# Patient Record
Sex: Male | Born: 1990
Health system: Southern US, Community
[De-identification: ages and names within clinical notes are randomized; demographics above are authoritative.]

---
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
## 2008-06-23 ENCOUNTER — Encounter: Payer: Self-pay | Admitting: Family Medicine

## 2008-12-17 ENCOUNTER — Ambulatory Visit: Payer: Self-pay | Admitting: Family Medicine

## 2008-12-17 DIAGNOSIS — F952 Tourette's disorder: Secondary | ICD-10-CM | POA: Insufficient documentation

## 2008-12-17 DIAGNOSIS — G43909 Migraine, unspecified, not intractable, without status migrainosus: Secondary | ICD-10-CM | POA: Insufficient documentation

## 2008-12-19 ENCOUNTER — Telehealth: Payer: Self-pay | Admitting: Family Medicine

## 2009-11-27 ENCOUNTER — Telehealth: Payer: Self-pay | Admitting: Family Medicine

## 2009-11-27 ENCOUNTER — Ambulatory Visit: Payer: Self-pay | Admitting: Family Medicine

## 2009-11-27 DIAGNOSIS — J019 Acute sinusitis, unspecified: Secondary | ICD-10-CM

## 2010-06-17 ENCOUNTER — Ambulatory Visit
Admission: RE | Admit: 2010-06-17 | Discharge: 2010-06-17 | Payer: Self-pay | Source: Home / Self Care | Attending: Family Medicine | Admitting: Family Medicine

## 2010-07-08 NOTE — Letter (Signed)
Summary: Records from [facility] 1991 - 2009  Records from [facility] 1991 - 2009   Imported By: Maryln Gottron 12/26/2008 12:38:41  _____________________________________________________________________  External Attachment:    Type:   Image     Comment:   External Document

## 2010-07-08 NOTE — Progress Notes (Signed)
Summary: sore throat/chest congestion  Phone Note Call from Patient   Caller: Patient Call For: Nelwyn Salisbury MD Summary of Call: Pt is asking to be seen today.  Sore throat with fever that started Sunday.....Marland Kitchenstill has sore throat and congestion in chest.   Please call back if he can be seen or what he is to do? [phone] Initial call taken by: Lynann Beaver CMA,  November 27, 2009 11:16 AM  Follow-up for Phone Call        please work him in this afternoon Follow-up by: Nelwyn Salisbury MD,  November 27, 2009 11:24 AM  Additional Follow-up for Phone Call Additional follow up Details #1::        Done per [facility]. Additional Follow-up by: Lynann Beaver CMA,  November 27, 2009 11:38 AM

## 2010-07-08 NOTE — Progress Notes (Signed)
Summary: advice  Phone Note Call from Patient Call back at [phone]   Caller: Patient Call For: fry Summary of Call: pt came in to have his PPD read and he was wanting Dr Claris Che opinion on an OTC acne medicine called Epiduo.  Wanted to know if he recommends it Initial call taken by: Alfred Levins, CMA,  December 19, 2008 9:30 AM  Follow-up for Phone Call        go ahead and try it, I haven't heard anything specific about it Follow-up by: Nelwyn Salisbury MD,  December 19, 2008 1:02 PM  Additional Follow-up for Phone Call Additional follow up Details #1::        l/m on pt cell phone with Dr Claris Che instructions Additional Follow-up by: Alfred Levins, CMA,  December 19, 2008 3:49 PM

## 2010-07-08 NOTE — Letter (Signed)
Summary: Immunization records [phone] - 2008  Immunization records 1991 - 2008   Imported By: Maryln Gottron 12/19/2008 13:10:24  _____________________________________________________________________  External Attachment:    Type:   Image     Comment:   External Document

## 2010-07-08 NOTE — Assessment & Plan Note (Signed)
Summary: ?sinus inf/ok per doc/njr   Vital Signs:  Patient profile:   20 year old male Weight:      141 pounds BMI:     20.90 Temp:     98.7 degrees F oral BP sitting:   96 / 66  (left arm) Cuff size:   regular  Vitals Entered By: Raechel Ache, RN (November 27, 2009 1:01 PM) CC: C/o fever on Sunday- now has sore throat, cough and congestion.   History of Present Illness: Here for one week of sinus pressure, HA, ST, and a dry cough. No fever.   Allergies: 1)  ! Penicillin  Past History:  Past Medical History: Reviewed history from 12/17/2008 and no changes required. migraines Tourettes, mild  Review of Systems  The patient denies anorexia, fever, weight loss, weight gain, vision loss, decreased hearing, hoarseness, chest pain, syncope, dyspnea on exertion, peripheral edema, hemoptysis, abdominal pain, melena, hematochezia, severe indigestion/heartburn, hematuria, incontinence, genital sores, muscle weakness, suspicious skin lesions, transient blindness, difficulty walking, depression, unusual weight change, abnormal bleeding, enlarged lymph nodes, angioedema, breast masses, and testicular masses.    Physical Exam  General:  Well-developed,well-nourished,in no acute distress; alert,appropriate and cooperative throughout examination Head:  Normocephalic and atraumatic without obvious abnormalities. No apparent alopecia or balding. Eyes:  No corneal or conjunctival inflammation noted. EOMI. Perrla. Funduscopic exam benign, without hemorrhages, exudates or papilledema. Vision grossly normal. Ears:  External ear exam shows no significant lesions or deformities.  Otoscopic examination reveals clear canals, tympanic membranes are intact bilaterally without bulging, retraction, inflammation or discharge. Hearing is grossly normal bilaterally. Nose:  External nasal examination shows no deformity or inflammation. Nasal mucosa are pink and moist without lesions or exudates. Mouth:  Oral  mucosa and oropharynx without lesions or exudates.  Teeth in good repair. Neck:  No deformities, masses, or tenderness noted. Lungs:  Normal respiratory effort, chest expands symmetrically. Lungs are clear to auscultation, no crackles or wheezes.   Impression & Recommendations:  Problem # 1:  ACUTE SINUSITIS, UNSPECIFIED (ICD-461.9)  His updated medication list for this problem includes:    Zithromax Z-pak 250 Mg Tabs (Azithromycin) ..... As directed  Complete Medication List: 1)  Haloperidol 0.5 Mg Tabs (Haloperidol) .... Two times a day 2)  Zithromax Z-pak 250 Mg Tabs (Azithromycin) .... As directed  Patient Instructions: 1)  Please schedule a follow-up appointment as needed .  Prescriptions: ZITHROMAX Z-PAK 250 MG TABS (AZITHROMYCIN) as directed  #1 x 0   Entered and Authorized by:   Mylani Gentry A Mateen Franssen MD   Signed by:   Florine Sprenkle A Marchell Froman MD on 11/27/2009   Method used:   [facility]  [address] [id number]       [address]       [place], [place]  [address]       Ph: [phone] or [phone]       Fax: [phone]   RxID:   [id number]

## 2010-07-08 NOTE — Assessment & Plan Note (Signed)
Summary: new to be est/mhf   Vital Signs:  Patient profile:   20 year old male Height:      69 inches Weight:      139 pounds BMI:     20.60 Temp:     97.6 degrees F oral Pulse rate:   64 / minute BP sitting:   110 / 70  (left arm) Cuff size:   regular  Vitals Entered By: Alfred Levins, CMA (December 17, 2008 9:10 AM) CC: new to be established, CPX    History of Present Illness: 20 yr old male to establish and for a cpx. He has not seen a primary care doctor for several years. He has 2 issues to discuss. First he has had migraine HAs for years but has never been treated for them. He averages 2 or 3 a week. These are classic generalized moderate to severe HAs which have light and sound sensitivity, and they are usually preceded by auras of flashing lights or scotomata. he has little in the way of nausea or vomiting. He always takes some Motrin and goes to bed with thesem then they are gone when he wakes up later. He has never used a triptan med. Second he was diagnosed with Tourettes Syndrome years ago but was never treated. THis shows up with facial tics which are problematic mostly when he is very tired or stressed. He is interested in trying something for these now. He will be entering [employer] this fall to study English as a major.   Preventive Screening-Counseling & Management  Alcohol-Tobacco     Smoking Status: never      Drug Use:  no.    Allergies (verified): 1)  ! Penicillin  Past History:  Past Medical History: migraines Tourettes, mild  Past Surgical History: Denies surgical history  Family History: Reviewed history and no changes required. Family History of Stroke F 1st degree relative <60 Emotional/Mental Illness DM   Social History: Reviewed history and no changes required. Single Never Smoked Alcohol use-no Drug use-no student at [employer] Smoking Status:  never Drug Use:  no  Review of Systems  The patient denies anorexia, fever, weight loss, weight gain,  vision loss, decreased hearing, hoarseness, chest pain, syncope, dyspnea on exertion, peripheral edema, prolonged cough, hemoptysis, abdominal pain, melena, hematochezia, severe indigestion/heartburn, hematuria, incontinence, genital sores, muscle weakness, suspicious skin lesions, transient blindness, difficulty walking, depression, unusual weight change, abnormal bleeding, enlarged lymph nodes, angioedema, breast masses, and testicular masses.    Physical Exam  General:  Well-developed,well-nourished,in no acute distress; alert,appropriate and cooperative throughout examination Head:  Normocephalic and atraumatic without obvious abnormalities. No apparent alopecia or balding. Eyes:  No corneal or conjunctival inflammation noted. EOMI. Perrla. Funduscopic exam benign, without hemorrhages, exudates or papilledema. Vision grossly normal. Ears:  External ear exam shows no significant lesions or deformities.  Otoscopic examination reveals clear canals, tympanic membranes are intact bilaterally without bulging, retraction, inflammation or discharge. Hearing is grossly normal bilaterally. Nose:  External nasal examination shows no deformity or inflammation. Nasal mucosa are pink and moist without lesions or exudates. Mouth:  Oral mucosa and oropharynx without lesions or exudates.  Teeth in good repair. Neck:  No deformities, masses, or tenderness noted. Chest Wall:  No deformities, masses, tenderness or gynecomastia noted. Lungs:  Normal respiratory effort, chest expands symmetrically. Lungs are clear to auscultation, no crackles or wheezes. Heart:  Normal rate and regular rhythm. S1 and S2 normal without gallop, murmur, click, rub or other extra sounds. Abdomen:  Bowel sounds positive,abdomen soft and non-tender without masses, organomegaly or hernias noted. Genitalia:  Testes bilaterally descended without nodularity, tenderness or masses. No scrotal masses or lesions. No penis lesions or urethral  discharge. Msk:  No deformity or scoliosis noted of thoracic or lumbar spine.   Pulses:  R and L carotid,radial,femoral,dorsalis pedis and posterior tibial pulses are full and equal bilaterally Extremities:  No clubbing, cyanosis, edema, or deformity noted with normal full range of motion of all joints.   Neurologic:  No cranial nerve deficits noted. Station and gait are normal. Plantar reflexes are down-going bilaterally. DTRs are symmetrical throughout. Sensory, motor and coordinative functions appear intact. Skin:  Intact without suspicious lesions or rashes Cervical Nodes:  No lymphadenopathy noted Axillary Nodes:  No palpable lymphadenopathy Inguinal Nodes:  No significant adenopathy Psych:  Cognition and judgment appear intact. Alert and cooperative with normal attention span and concentration. No apparent delusions, illusions, hallucinations   Impression & Recommendations:  Problem # 1:  HEALTH MAINTENANCE EXAM (ICD-V70.0)  Problem # 2:  MIGRAINE HEADACHE (ICD-346.90)  His updated medication list for this problem includes:    Imitrex 100 Mg Tabs (Sumatriptan succinate) .Marland Kitchen... As needed headache  Problem # 3:  TOURETTE'S DISORDER (ICD-307.23)  Complete Medication List: 1)  Imitrex 100 Mg Tabs (Sumatriptan succinate) .... As needed headache 2)  Haloperidol 0.5 Mg Tabs (Haloperidol) .... Two times a day  Other Orders: Tdap => 72yrs IM (62831) Admin 1st Vaccine (51761) TB Skin Test [phone])  Patient Instructions: 1)  I reccomended he get fasting labs, but he declined. Given a tDaP and a PPD for school. Try low dose Haldol for the Tourettes and Imitrex for the HAs. Recheck here in 3 weeks.  Prescriptions: HALOPERIDOL 0.5 MG TABS (HALOPERIDOL) two times a day  #60 x 5   Entered and Authorized by:   Nelwyn Salisbury MD   Signed by:   Nelwyn Salisbury MD on 12/17/2008   Method used:   Print then Give to Patient   RxID:   [id number] IMITREX 100 MG TABS (SUMATRIPTAN SUCCINATE) as  needed headache  #12 x 5   Entered and Authorized by:   Nelwyn Salisbury MD   Signed by:   Nelwyn Salisbury MD on 12/17/2008   Method used:   Print then Give to Patient   RxID:   [id number]    Immunization History:  Hepatitis B Immunization History:    Hepatitis B # 1:  historical (07/22/1992)    Hepatitis B # 2:  historical (09/16/1992)    Hepatitis B # 3:  historical (02/02/1993)  DPT Immunization History:    DPT # 1:  historical (06/25/1989)    DPT # 2:  historical (08/17/1989)    DPT # 3:  historical (10/28/1989)    DPT # 4:  historical (12/07/1990)    DPT # 5:  historical (01/19/1995)  HIB Immunization History:    HIB # 1:  historical (06/25/1989)    HIB # 2:  historical (08/17/1989)    HIB # 3:  historical (10/28/1989)    HIB # 4:  historical (07/25/1990)  Polio Immunization History:    Polio # 1:  historical (06/25/1989)    Polio # 2:  historical (08/17/1989)    Polio # 3:  historical (12/07/1990)    Polio # 4:  historical (01/19/1995)  MMR Immunization History:    MMR # 1:  historical (07/25/1990)    MMR # 2:  historical (01/19/1995)  Varicella Immunization History:  Varicella # 1:  historical (12/06/1997)    Varicella # 2:  historical (11/23/2006)  Influenza Immunization History:    Influenza:  historical (04/02/1998)  Meningococcal Immunization History:    Meningococcal:  historical (11/23/2006)  Hepatitis A Immunization History:    Hepatitis A # 1:  historical (11/23/2006)    Hepatitis A # 2:  historical (06/24/2007)  Immunizations Administered:  Tetanus Vaccine:    Vaccine Type: Tdap    Site: right deltoid    Mfr: GlaxoSmithKline    Dose: 0.5 ml    Route: IM    Given by: Alfred Levins, CMA    Exp. Date: 03/09/2011    Lot #: [id number]  PPD Skin Test:    Vaccine Type: PPD    Site: left forearm    Mfr: Sanofi Pasteur    Dose: 0.1 ml    Route: ID    Given by: Alfred Levins, CMA    Exp. Date: 05/24/2011    Lot #: [id number]    Appended  Document: new to be est/mhf    Nurse Visit   Allergies: 1)  ! Penicillin  PPD Results    Date of reading: 12/19/2008    Results: < 5mm    Interpretation: negative

## 2010-07-10 NOTE — Assessment & Plan Note (Signed)
Summary: fu on migraines/njr   Vital Signs:  Patient profile:   20 year old male Weight:      155 pounds O2 Sat:      98 % Temp:     98.7 degrees F Pulse rate:   84 / minute BP sitting:   122 / 78  (left arm) Cuff size:   regular  Vitals Entered By: Pura Spice, RN (June 17, 2010 3:50 PM) CC: c/o frequent headaaches need rx haldol. wants  mole ck groin area    History of Present Illness: Here to follow up on migraines and Tourettes. He did very well on Haldol, but got away from this due to cost concerns. Now in the past few months the Tourettes has gotten slightly worse. The HAs come several times a month. He did not fill the Imitrex rx I gave him last year because his insurance would not cover it.   Allergies: 1)  ! Penicillin  Past History:  Past Medical History: Reviewed history from 12/17/2008 and no changes required. migraines Tourettes, mild  Past Surgical History: Reviewed history from 12/17/2008 and no changes required. Denies surgical history  Review of Systems  The patient denies anorexia, fever, weight loss, weight gain, vision loss, decreased hearing, hoarseness, chest pain, syncope, dyspnea on exertion, peripheral edema, prolonged cough, headaches, hemoptysis, abdominal pain, melena, hematochezia, severe indigestion/heartburn, hematuria, incontinence, genital sores, muscle weakness, suspicious skin lesions, transient blindness, difficulty walking, depression, unusual weight change, abnormal bleeding, enlarged lymph nodes, angioedema, breast masses, and testicular masses.    Physical Exam  General:  Well-developed,well-nourished,in no acute distress; alert,appropriate and cooperative throughout examination Head:  Normocephalic and atraumatic without obvious abnormalities. No apparent alopecia or balding. Eyes:  No corneal or conjunctival inflammation noted. EOMI. Perrla. Funduscopic exam benign, without hemorrhages, exudates or papilledema. Vision grossly  normal. Ears:  External ear exam shows no significant lesions or deformities.  Otoscopic examination reveals clear canals, tympanic membranes are intact bilaterally without bulging, retraction, inflammation or discharge. Hearing is grossly normal bilaterally. Nose:  External nasal examination shows no deformity or inflammation. Nasal mucosa are pink and moist without lesions or exudates. Mouth:  Oral mucosa and oropharynx without lesions or exudates.  Teeth in good repair. Neck:  No deformities, masses, or tenderness noted. Lungs:  Normal respiratory effort, chest expands symmetrically. Lungs are clear to auscultation, no crackles or wheezes. Heart:  Normal rate and regular rhythm. S1 and S2 normal without gallop, murmur, click, rub or other extra sounds. Neurologic:  No cranial nerve deficits noted. Station and gait are normal. Plantar reflexes are down-going bilaterally. DTRs are symmetrical throughout. Sensory, motor and coordinative functions appear intact.   Impression & Recommendations:  Problem # 1:  MIGRAINE HEADACHE (ICD-346.90)  Problem # 2:  TOURETTE'S DISORDER (ICD-307.23)  Complete Medication List: 1)  Haloperidol 0.5 Mg Tabs (Haloperidol) .... Two times a day  Patient Instructions: 1)  Refilled meds. Advised him to reduce his caffeine use. He will ask his insurance company what migraine meds they prefer and then let me know.  Prescriptions: HALOPERIDOL 0.5 MG TABS (HALOPERIDOL) two times a day  #60 x 11   Entered and Authorized by:   Nelwyn Salisbury MD   Signed by:   Nelwyn Salisbury MD on 06/17/2010   Method used:   [facility]        [facility]  [address] [phone] ([facility]ail[facility]       [address]  [place], [place]  [address]       Ph: [phone] or [phone]       Fax: [phone]   RxID:   [id number]    Orders Added: 1)  Est. Patient Level IV [66440]

## 2010-07-30 ENCOUNTER — Other Ambulatory Visit: Payer: Self-pay | Admitting: *Deleted

## 2010-07-30 NOTE — Telephone Encounter (Signed)
Treating Tourette's can be very tricky to do. I suggest he cut the Haldol back to 1/2 a tablet bid, and after that he needs to see a Neurologist for this. Please refer to Neurology for Tourette's syndrome.

## 2010-07-30 NOTE — Telephone Encounter (Signed)
Pt feels Haldol is making him too drowsy????   Fell asleep in class. [facility] Not[facility] it is causing him to become depressed.

## 2010-07-30 NOTE — Telephone Encounter (Signed)
Spoke with pt and he does not want referral to neurologist at this time.  Dr Clent Ridges aware.

## 2012-11-16 ENCOUNTER — Ambulatory Visit (INDEPENDENT_AMBULATORY_CARE_PROVIDER_SITE_OTHER)
Admission: RE | Admit: 2012-11-16 | Discharge: 2012-11-16 | Disposition: A | Discharge status: Home / Self Care | Payer: Managed Care, Other (non HMO) | Source: Ambulatory Visit | Attending: Family Medicine | Admitting: Family Medicine

## 2012-11-16 ENCOUNTER — Telehealth: Payer: Self-pay | Admitting: Family Medicine

## 2012-11-16 ENCOUNTER — Ambulatory Visit (INDEPENDENT_AMBULATORY_CARE_PROVIDER_SITE_OTHER): Payer: Managed Care, Other (non HMO) | Admitting: Family Medicine

## 2012-11-16 VITALS — BP 118/76 | Temp 98.6°F | Wt 160.0 lb

## 2012-11-16 DIAGNOSIS — M25561 Pain in right knee: Secondary | ICD-10-CM

## 2012-11-16 DIAGNOSIS — M25569 Pain in unspecified knee: Secondary | ICD-10-CM

## 2012-11-16 NOTE — Patient Instructions (Addendum)
-  go get xrays, we will call you with results in the next few days  -heat for 15 minutes twice daily  -gentle activities  -ibuprofen 400mg  2 times dialy or tylenol 500-1000mg  two times daily as needed for pain

## 2012-11-16 NOTE — Telephone Encounter (Signed)
Pt would like to let you know he had his Xrays and is anxious to get results.

## 2012-11-16 NOTE — Progress Notes (Signed)
Quick Note:  Called and spoke with pt and pt is aware. ______ 

## 2012-11-16 NOTE — Progress Notes (Signed)
Chief Complaint  Patient presents with  . 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  No past medical history on file.  No family history on file.  History   Social History  . Marital Status: Married    Spouse Name: N/A    Number of Children: N/A  . Years of Education: N/A   Social History Main Topics  . Smoking status: Not on file  . Smokeless tobacco: Not on file  . Alcohol Use: Not on file  . Drug Use: Not on file  . Sexually Active: Not on file   Other Topics Concern  . Not on file   Social History Narrative  . No narrative on file    No current outpatient prescriptions on file.  EXAM:  Filed Vitals:   11/16/12 0958  BP: 118/76  Temp: 98.6 F (37 C)    Body mass index is 23.62 kg/(m^2).  GENERAL: vitals reviewed and listed above, alert, oriented, appears well hydrated and in no acute distress  HEENT: atraumatic, conjunttiva clear, no obvious abnormalities on inspection of external nose and ears  NECK: no obvious masses on inspection  LUNGS: clear to auscultation bilaterally, no wheezes, rales or rhonchi, good air movement  CV: HRRR, no peripheral edema  MS: moves all extremities without noticeable abnormality -normal gait -normal appearance of both legs -TTP of R medial malleolus, R medial gastroc distally - otherwise normal exam, NV intact distally  PSYCH: pleasant and cooperative, no obvious depression or anxiety  ASSESSMENT  AND PLAN:  Discussed the following assessment and plan:  Pain in joint, lower leg, right - Plan: DG Ankle Complete Right, DG Tibia/Fibula Right  -suspect strain.sprain soft tissues from wrestling/new shoes - given degree of TTP and some bony on exam and his anxiety plain films to r/u stress fx, etc Recommendations per orders an instructions, risks and use of medications and return precautions discussed. -Patient advised to return or notify a doctor immediately if symptoms worsen or persist or new concerns arise.  Patient Instructions  -go get xrays, we will call you with results in the next few days  -heat for 15 minutes twice daily  -gentle activities  -ibuprofen 400mg  2 times dialy or tylenol 500-1000mg  two times daily as needed for pain     KIM, HANNAH R.

## 2013-11-08 ENCOUNTER — Encounter: Payer: Self-pay | Admitting: Family Medicine

## 2013-11-08 ENCOUNTER — Ambulatory Visit (INDEPENDENT_AMBULATORY_CARE_PROVIDER_SITE_OTHER): Payer: BC Managed Care – PPO | Admitting: Family Medicine

## 2013-11-08 ENCOUNTER — Telehealth: Payer: Self-pay | Admitting: Family Medicine

## 2013-11-08 VITALS — BP 109/77 | HR 71 | Temp 98.7°F | Ht 69.0 in | Wt 156.0 lb

## 2013-11-08 DIAGNOSIS — IMO0002 Reserved for concepts with insufficient information to code with codable children: Secondary | ICD-10-CM

## 2013-11-08 MED ORDER — CEPHALEXIN 500 MG PO CAPS: 500.0000 mg | ORAL_CAPSULE | Freq: Three times a day (TID) | ORAL | Status: AC

## 2013-11-08 NOTE — Progress Notes (Signed)
Pre visit review using our clinic review tool, if applicable. No additional management support is needed unless otherwise documented below in the visit note. 

## 2013-11-08 NOTE — Progress Notes (Signed)
   Subjective:    Patient ID: Roberto Powers, male    DOB: [birth year], 23 y.o.   MRN: [id number]  HPI Here for 2 weeks of swelling around the nail of the left third finger. Using hot Epsom salt soaks.    Review of Systems  Constitutional: Negative.   Skin: Positive for color change.       Objective:   Physical Exam  Constitutional: He appears well-developed and well-nourished.  Skin:  The left third finger has an area of erythema, swelling and tenderness at the nail edge          Assessment & Plan:  The area was cleansed with alcohol and then lanced with a sterile needle. Purulent material was expressed. Cover with Keflex for 10 days

## 2013-11-08 NOTE — Telephone Encounter (Signed)
Pt called wanting to know if it is ok to take the Keflex prescribed since he has a penicillin allergy.  Per Dr. Clent Ridges, yes it is okay.  I called pt back to inform him and he verbalized understanding.

## 2014-01-04 IMAGING — CR DG ANKLE COMPLETE 3+V*R*
3 series · 3 of 3 positions shown · non-contrast
Comparison: None.

CLINICAL DATA: Right ankle pain beginning 1 day ago.

RIGHT ANKLE - COMPLETE 3+ VIEW

[view not recorded (1 of 3)]
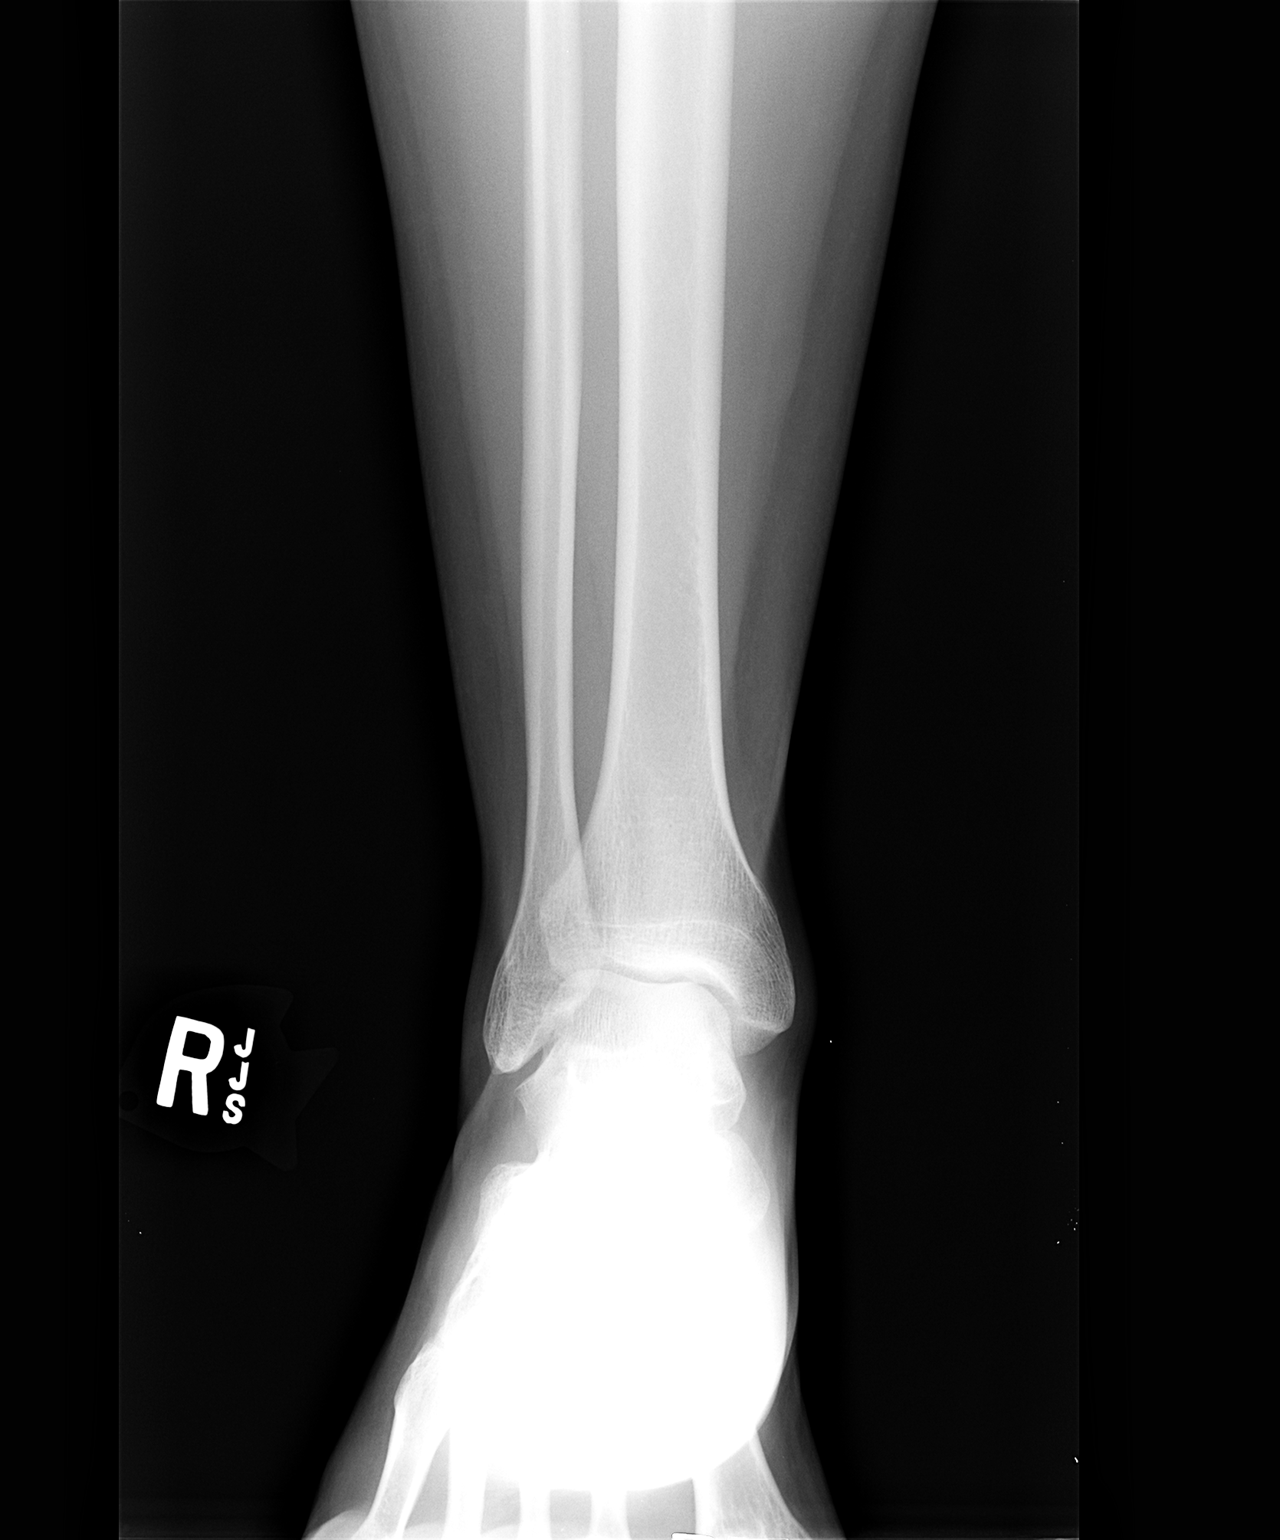

[view not recorded (2 of 3)]
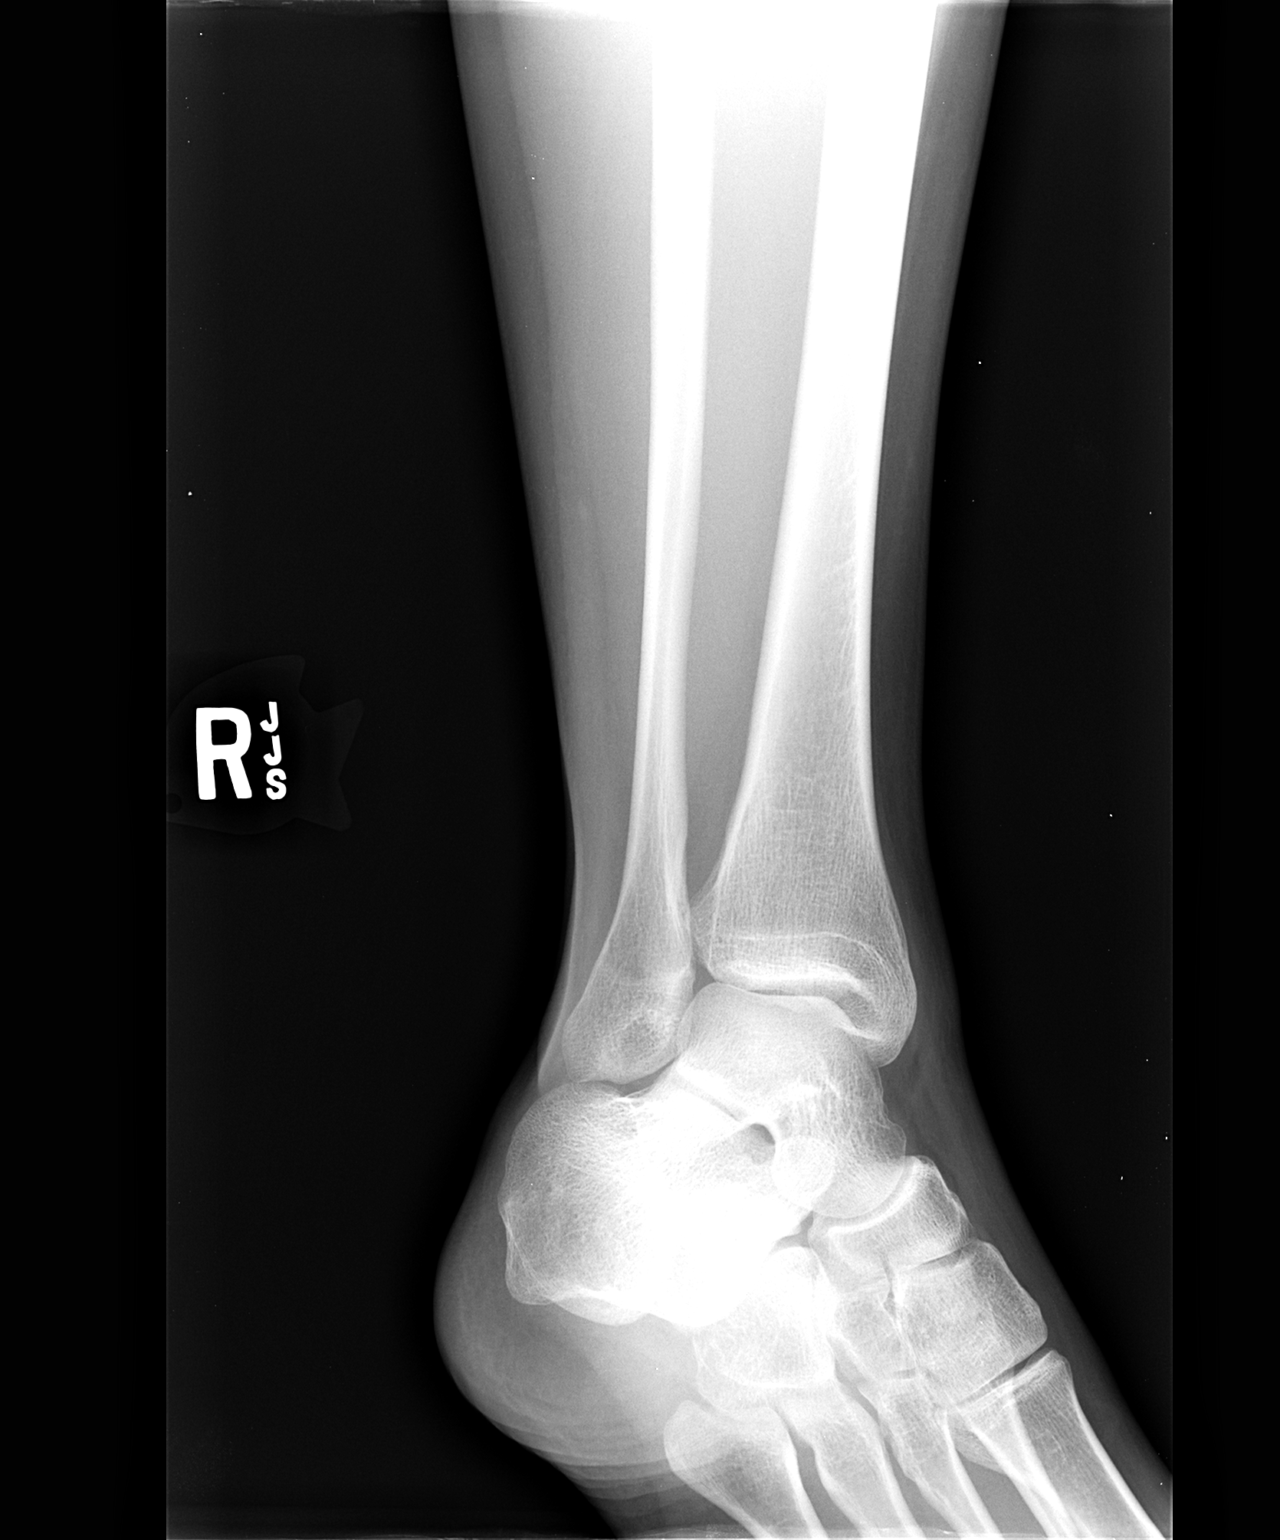

[view not recorded (3 of 3)]
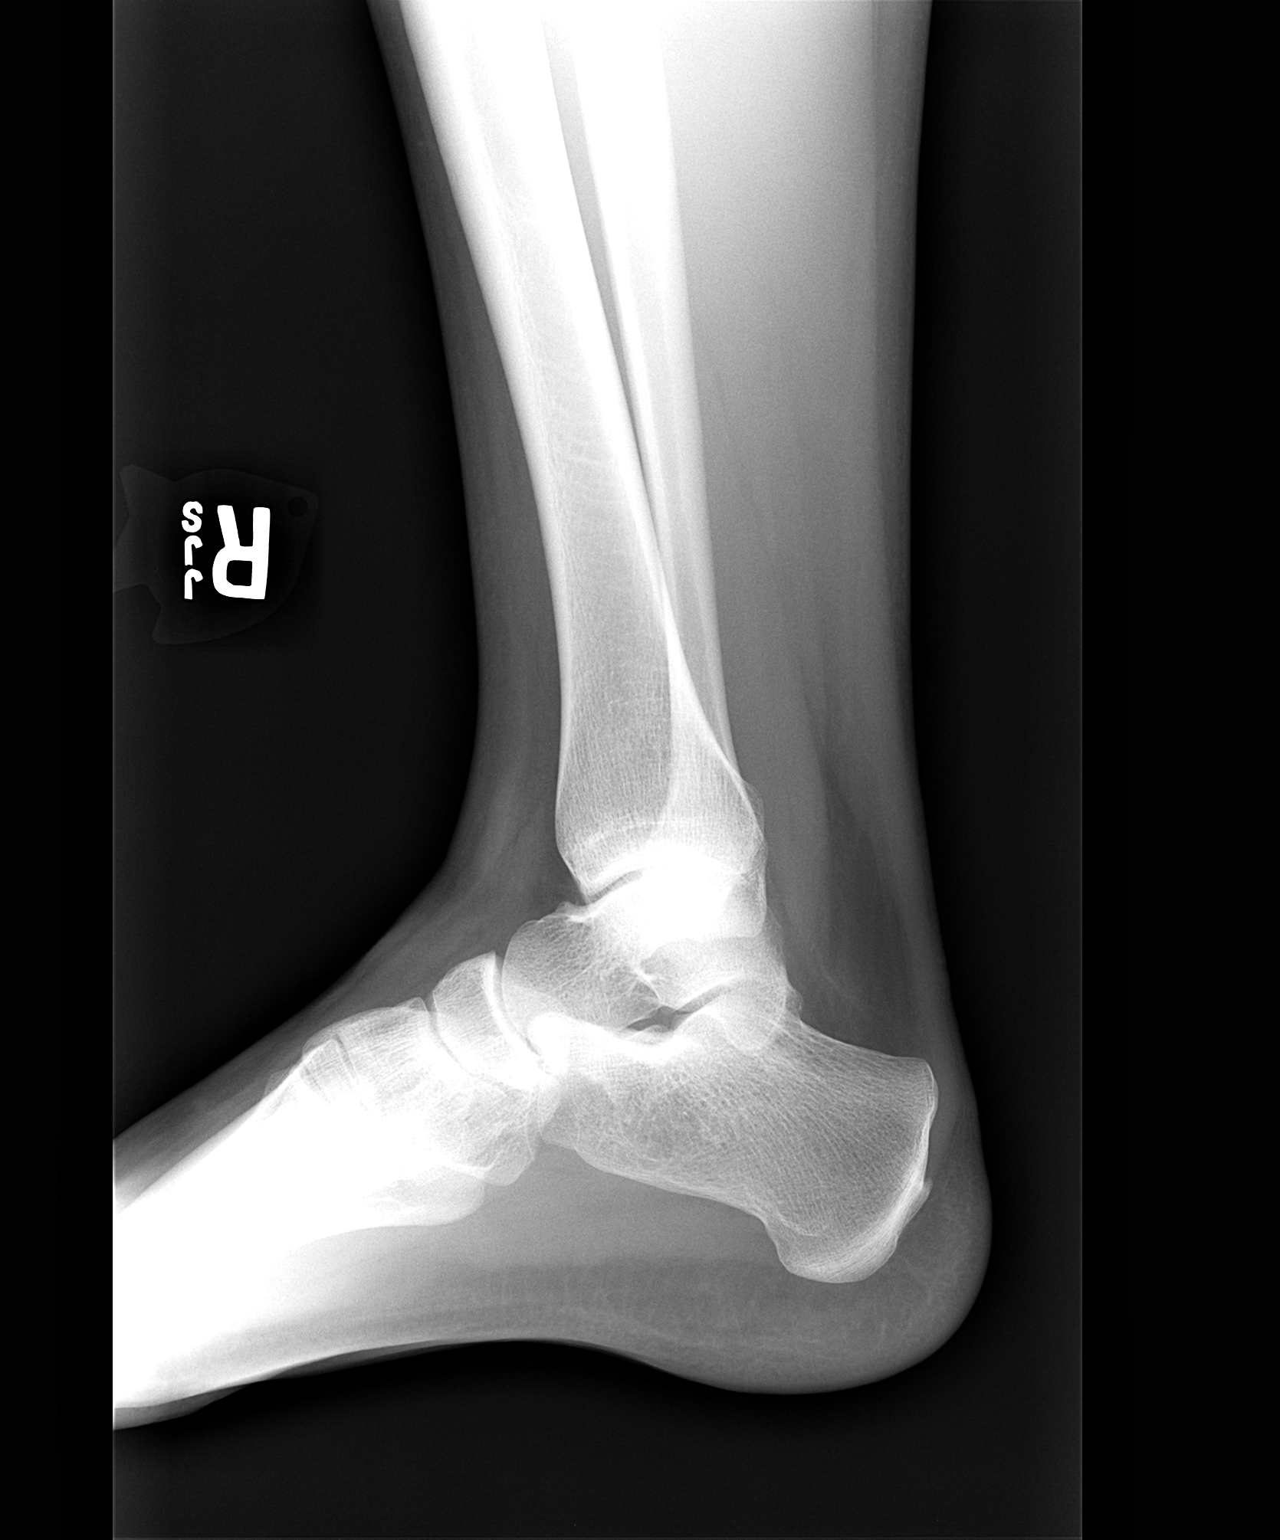

[3 of 3 positions shown; findings below may reference images not displayed]

FINDINGS: Imaged bones, joints and soft tissues appear normal.
IMPRESSION: Negative exam.

## 2014-01-04 IMAGING — CR DG TIBIA/FIBULA 2V*R*
2 series · 2 of 2 positions shown · non-contrast
Comparison: None.

CLINICAL DATA: Right lower leg pain beginning 1 day ago.

RIGHT TIBIA AND FIBULA - 2 VIEW

[view not recorded (1 of 2)]
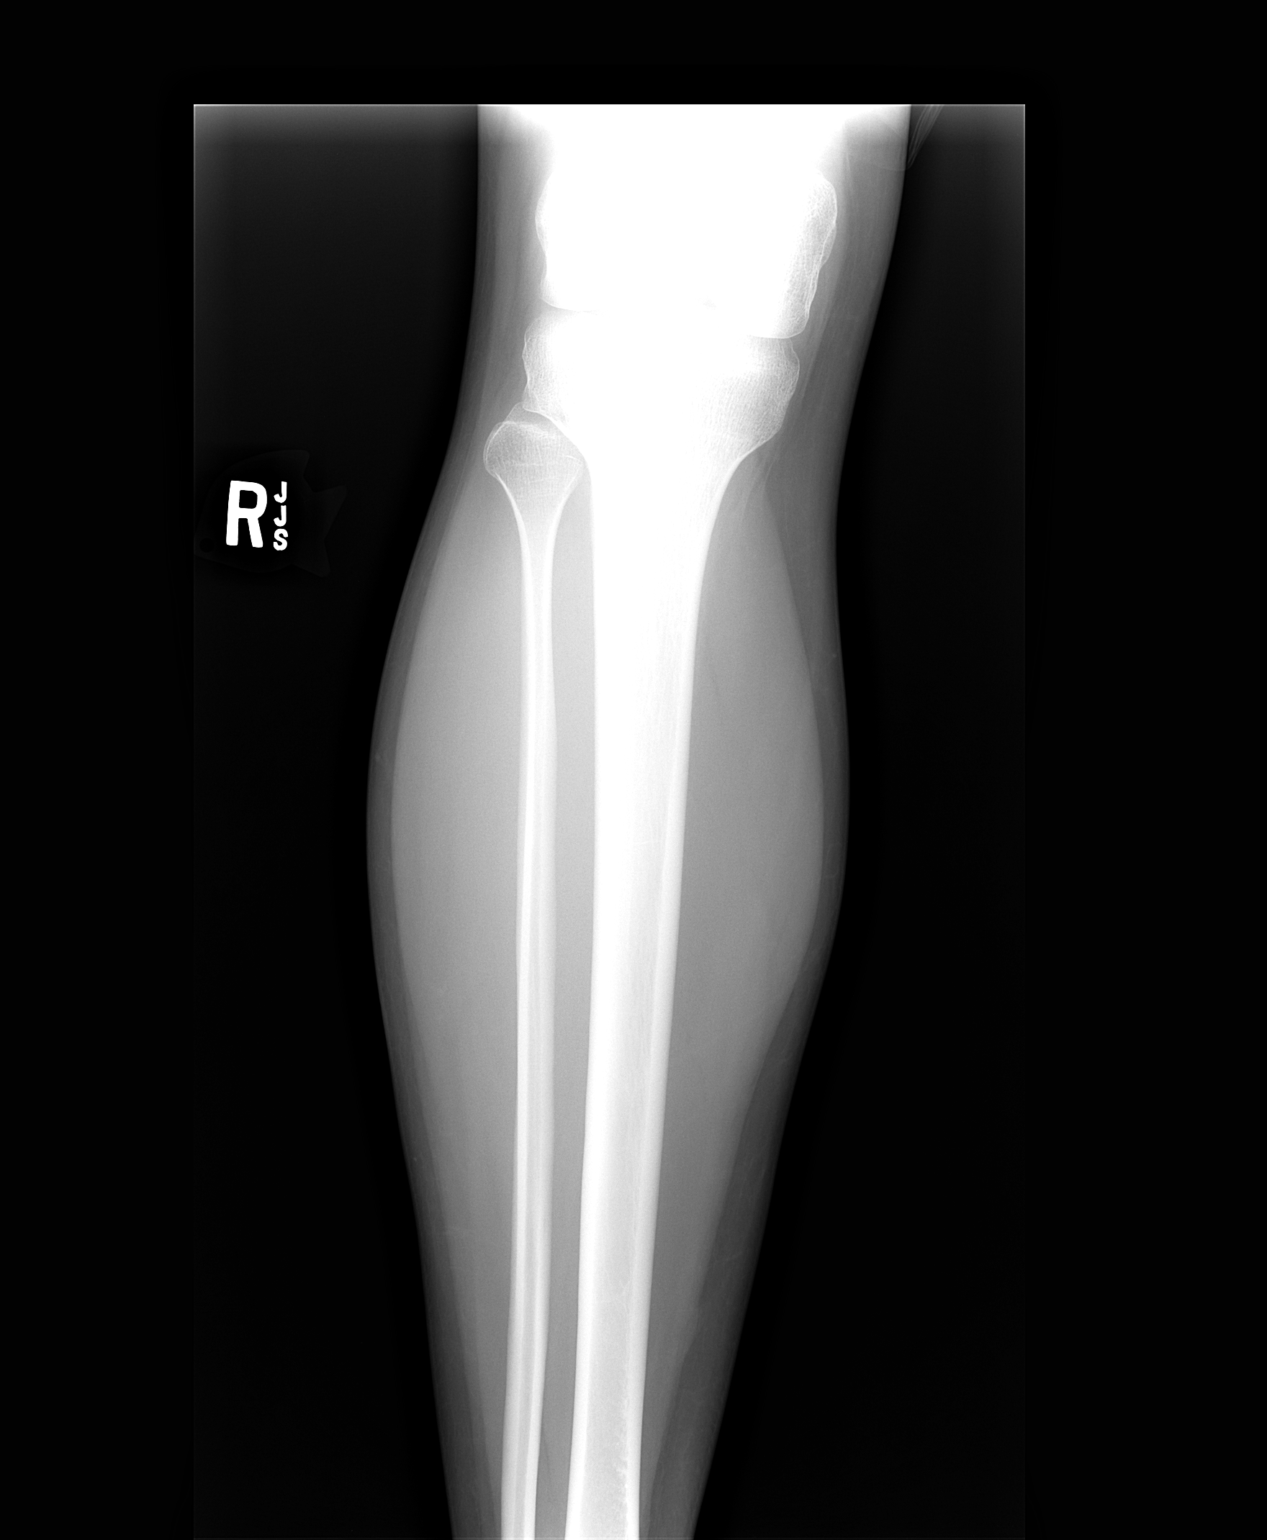

[view not recorded (2 of 2)]
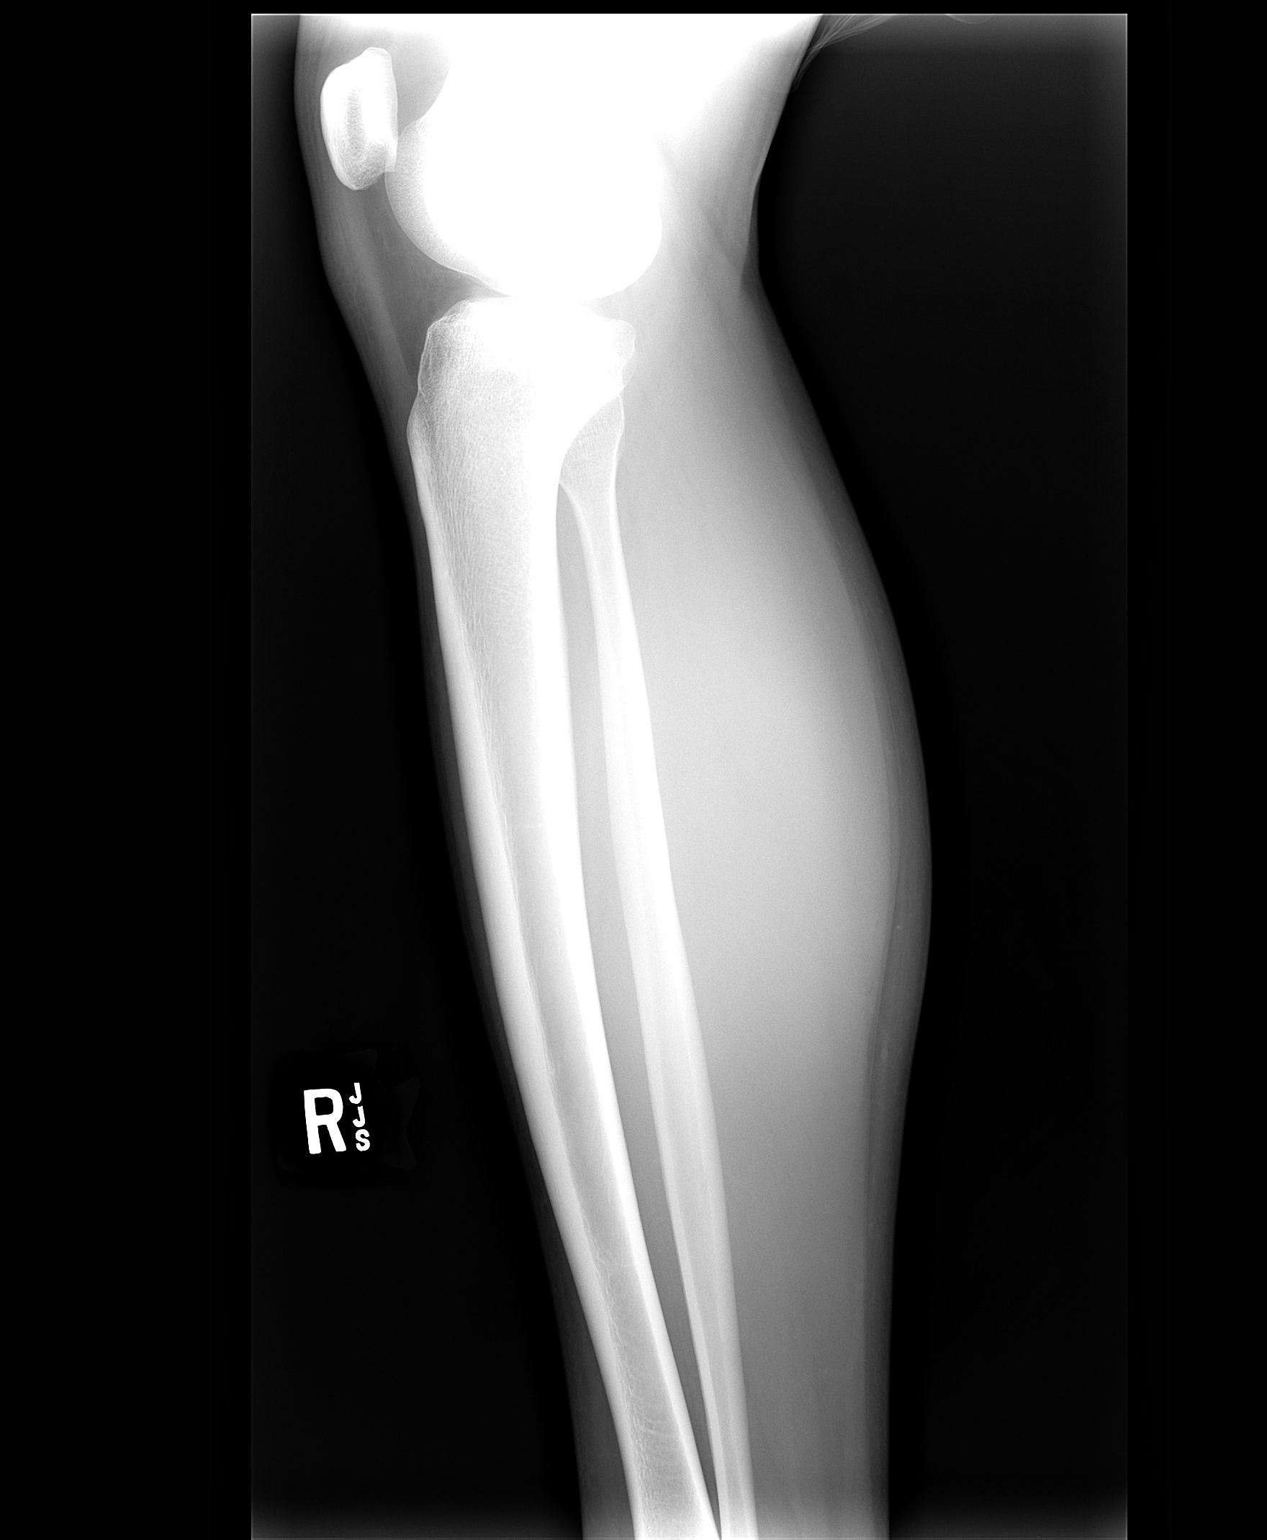

[2 of 2 positions shown; findings below may reference images not displayed]

FINDINGS: Imaged bones, joints and soft tissues appear normal.
IMPRESSION: Negative exam.

## 2015-02-22 ENCOUNTER — Encounter: Payer: Self-pay | Admitting: Family Medicine

## 2015-02-22 ENCOUNTER — Ambulatory Visit (INDEPENDENT_AMBULATORY_CARE_PROVIDER_SITE_OTHER): Payer: BLUE CROSS/BLUE SHIELD | Admitting: Family Medicine

## 2015-02-22 VITALS — BP 111/71 | HR 64 | Temp 99.0°F | Ht 69.0 in | Wt 154.0 lb

## 2015-02-22 DIAGNOSIS — B353 Tinea pedis: Secondary | ICD-10-CM

## 2015-02-22 DIAGNOSIS — F952 Tourette's disorder: Secondary | ICD-10-CM | POA: Diagnosis not present

## 2015-02-22 MED ORDER — KETOCONAZOLE 2 % EX CREA: 1.0000 "application " | TOPICAL_CREAM | Freq: Two times a day (BID) | CUTANEOUS | Status: DC

## 2015-02-22 MED ORDER — FLUCONAZOLE 150 MG PO TABS: 150.0000 mg | ORAL_TABLET | Freq: Every day | ORAL | Status: DC

## 2015-02-22 NOTE — Progress Notes (Signed)
   Subjective:    Patient ID: Roberto Powers, male    DOB: July 22, 1990, 24 y.o.   MRN: [id number]  HPI Here to check a rash that appeared on the bottom of his right foot about 3 months ago. This burns and itches. OTC creams do not help. He also asks about treatment options for his Tourettes syndrome. This has bothered him for years by causing involuntary tics and he is considering treating these again. About 5 years ago he tried Haldol briefly but stopped it due to side effects. The tics are beginning to interfere with his job.    Review of Systems  Constitutional: Negative.   Respiratory: Negative.   Cardiovascular: Negative.   Skin: Positive for rash.  Neurological: Negative.        Objective:   Physical Exam  Constitutional: He is oriented to person, place, and time. He appears well-developed and well-nourished.  Cardiovascular: Normal rate, regular rhythm, normal heart sounds and intact distal pulses.   Pulmonary/Chest: Effort normal and breath sounds normal.  Neurological: He is alert and oriented to person, place, and time. He exhibits normal muscle tone. Coordination normal.  Skin:  Scaly rash on the sole of the right foot          Assessment & Plan:  Treat the athlete's foot with 30 day of oral fluconazole and topical ketoconazole cream. We discussed treatment options for Tourettes, and he will let me know if he wishes to try some of them

## 2015-02-22 NOTE — Progress Notes (Signed)
Pre visit review using our clinic review tool, if applicable. No additional management support is needed unless otherwise documented below in the visit note. 

## 2015-03-08 ENCOUNTER — Encounter: Payer: Self-pay | Admitting: Family Medicine

## 2015-03-08 ENCOUNTER — Ambulatory Visit (INDEPENDENT_AMBULATORY_CARE_PROVIDER_SITE_OTHER): Payer: BLUE CROSS/BLUE SHIELD | Admitting: Family Medicine

## 2015-03-08 VITALS — BP 110/79 | HR 74 | Temp 99.1°F | Ht 69.0 in | Wt 156.0 lb

## 2015-03-08 DIAGNOSIS — L989 Disorder of the skin and subcutaneous tissue, unspecified: Secondary | ICD-10-CM | POA: Diagnosis not present

## 2015-03-08 NOTE — Progress Notes (Signed)
Pre visit review using our clinic review tool, if applicable. No additional management support is needed unless otherwise documented below in the visit note. 

## 2015-03-08 NOTE — Progress Notes (Signed)
   Subjective:    Patient ID: Roberto Powers, male    DOB: [birth year], 24 y.o.   MRN: [id number]  HPI Here to check a mole on the lower abdomen that has been present for several years but which is getting darker in color.    Review of Systems  Constitutional: Negative.   Skin: Positive for color change.       Objective:   Physical Exam  Constitutional: He appears well-developed and well-nourished.  Skin:  The suprapubic area has a 4 mm well circumscribed macular lesion which is variegated in color from tan to black           Assessment & Plan:  This appears to be a dysplastic nevus. [facility]

## 2015-08-23 ENCOUNTER — Telehealth: Payer: Self-pay | Admitting: Family Medicine

## 2015-08-23 NOTE — Telephone Encounter (Signed)
Pt states he discussed with Dr Clent RidgesFry about his teretes. Pt states due to stress, and his job, his symptoms have increased.  Pt would like to try clonazepam. . Pt has read about this and wants to try. Pt has tried klonopin in the past. Pt states his level of comfort is not where he wants it to be. Pt is working in [place] and will not be back to [place] until end of April.  Pt would like to know if Dr Clent RidgesFry will send him a rx for clonazepam., Sent to : [facility] Phone:  [phone]  [address] [place], [place]

## 2015-08-23 NOTE — Telephone Encounter (Signed)
Call in Clonazepam 0.5 mg to use TID prn anxiety, #90 with no rf.

## 2015-08-26 ENCOUNTER — Telehealth: Payer: Self-pay | Admitting: *Deleted

## 2015-08-26 MED ORDER — CLONAZEPAM 0.5 MG PO TABS: 0.5000 mg | ORAL_TABLET | Freq: Three times a day (TID) | ORAL | Status: DC | PRN

## 2015-08-26 NOTE — Telephone Encounter (Signed)
Pt would like to pick up this rx tonight or in the am. Thank yoiu

## 2015-08-26 NOTE — Telephone Encounter (Signed)
I called in script to below pharmacy requested and left a voice message for pt with this information.

## 2015-08-26 NOTE — Telephone Encounter (Signed)
PLEASE NOTE: All timestamps contained within this report are represented as [place] Standard Time. CONFIDENTIALTY NOTICE: This fax transmission is intended only for the addressee. It contains information that is legally privileged, confidential or otherwise protected from use or disclosure. If you are not the intended recipient, you are strictly prohibited from reviewing, disclosing, copying using or disseminating any of this information or taking any action in reliance on or regarding this information. If you have received this fax in error, please notify us immediately by telephone so that we can arrange for its return to us. Phone: [phone], Toll-Free: [phone], Fax: [phone] Page: 1 of 1 Call Id: [id number] [facility] - Client TELEPHONE ADVICE RECORD [facility] Patient Name: Roberto Powers Gender: Male DOB: May 03, 1991 Age: 25 Y 4 M 8 D Return Phone Number: [phone] (Primary) Address: City/State/Zip: [place] Client [facility] - Client Client Site [facility] - Night Physician Gershon CraneFry, Stephen Contact Type Call Who Is Calling Patient / Member / Family / Caregiver Call Type Triage / Clinical Caller Name S[place] Relationship To Patient Self Return Phone Number [phone] (Primary) Chief Complaint Paging or Request for Consult Reason for Call Symptomatic / Request for Health Information Initial Comment Caller states need a rx called in the doctor never called it in and was told I could not get anything after hours do not want t to speak to the on call for [facility] not the person I spoke to earlier Translation No Nurse Assessment Nurse: Debera Latalston, RN, Tinnie GensJeffrey Date/Time (Eastern Time): 08/24/2015 1:50:38 PM Please select the assessment type ---Request for controlled medication refill Additional Documentation ---Caller states need a rx called in the doctor never called it in and was told  I could not get anything after hours do not want t to speak to the on call for [facility] not the person I spoke to earlier. Needing refill on clonazepam. Is there an on-call physician for the client? ---Yes Do the client directives specifically allow for paging the on-call regarding scheduled drugs? ---No Additional Documentation ---Advised caller that he would need to contact office on Monday about refill for controlled substance. Guidelines Guideline Title Affirmed Question Affirmed Notes Nurse Date/Time (Eastern Time) Disp. Time Lamount Cohen(Eastern Time) Disposition Final User 08/24/2015 1:25:51 PM Attempt made - message left Kennieth RadRalston, RN, Jeffrey 08/24/2015 1:51:38 PM Clinical Call Yes Debera Latalston, RN, Tinnie GensJeffrey

## 2015-08-26 NOTE — Telephone Encounter (Signed)
A script was ordered and called in today, left a voice message for pt with this information, please see previous phone message.

## 2015-12-16 ENCOUNTER — Telehealth: Payer: Self-pay | Admitting: Family Medicine

## 2015-12-16 ENCOUNTER — Telehealth: Payer: Self-pay | Admitting: *Deleted

## 2015-12-16 NOTE — Telephone Encounter (Signed)
Patient concerned might be having reaction to medication: Clonazepam. Says he took medication in college and had to stop it due to reaction. Please call for appointment or provide home advice.    PLEASE NOTE: All timestamps contained within this report are represented as [place] Standard Time. CONFIDENTIALTY NOTICE: This fax transmission is intended only for the addressee. It contains information that is legally privileged, confidential or otherwise protected from use or disclosure. If you are not the intended recipient, you are strictly prohibited from reviewing, disclosing, copying using or disseminating any of this information or taking any action in reliance on or regarding this information. If you have received this fax in error, please notify us immediately by telephone so that we can arrange for its return to us. Phone: [phone], Toll-Free: [phone], Fax: [phone] Page: 1 of 2 Call Id: [id number] [facility] - Client TELEPHONE ADVICE RECORD [facility] Patient Name: Roberto PeaceBRAD Kissick Gender: Male DOB: [birth year] Age: 25 Y 7 M 28 D Return Phone Number: [phone] (Primary) Address: City/State/Zip: [place] Client [facility] - Client Client Site  [facility] Physician Roberto Powers, Roberto Powers Contact Type Call Who Is Calling Patient / Member / Family / Caregiver Call Type Triage / Clinical Caller Name Roberto Powers Relationship To Patient Self Return Phone Number [phone] (Primary) Chief Complaint Medication reaction Reason for Call Symptomatic / Request for Health Information Initial Comment Caller states he has a questions about his medications he is taking- Believes he is having a reaction to the medication. Becoming very tired after taking. Wants to schedule an appt for next week as well PreDisposition Call Doctor Translation No Nurse Assessment Nurse: Mills-Hernandez, Roberto Powers, Roberto SineNancy  Date/Time (Eastern Time): 12/14/2015 3:30:01 PM Confirm and document reason for call. If symptomatic, describe symptoms. You must click the next button to save text entered. ---Caller states he has a questions about his medications he is taking- Believes he is having a reaction to the medication. Becoming very tired after taking. Wants to schedule an appt for next week as well. The medication is clonazepam. Caller states that he has taken this medication before when he was in college and had the same side effect and had to stop the medication. Has the patient traveled out of the country within the last 30 days? ---No Does the patient have any new or worsening symptoms? ---Yes Will a triage be completed? ---Yes Related visit to physician within the last 2 weeks? ---No Does the PT have any chronic conditions? (i.e. diabetes, asthma, etc.) ---Yes List chronic conditions. ---Terrets Is this a behavioral health or substance abuse call? ---No Guidelines Guideline Title Affirmed Question Affirmed Notes Nurse Date/Time (Eastern Time) Medication Question Call Caller has NONURGENT medication question about med that Mills-Hernandez, Roberto Powers, Roberto Sineancy 12/14/2015 3:42:17 PM PLEASE NOTE: All timestamps contained within this report are represented as [place] Standard Time. CONFIDENTIALTY NOTICE: This fax transmission is intended only for the addressee. It contains information that is legally privileged, confidential or otherwise protected from use or disclosure. If you are not the intended recipient, you are strictly prohibited from reviewing, disclosing, copying using or disseminating any of this information or taking any action in reliance on or regarding this information. If you have received this fax in error, please notify us immediately by telephone so that we can arrange for its return to us. Phone: [phone], Toll-Free: [phone], Fax: [phone] Page: 2 of 2 Call Id:  [id number] Guidelines Guideline Title Affirmed Question Affirmed Notes  Nurse Date/Time Lamount Cohen Time) PCP prescribed and triager unable to answer question Disp. Time Lamount Cohen Time) Disposition Final User 12/14/2015 3:48:25 PM Call PCP within 24 Hours Yes Mills-Hernandez, Roberto Powers, Roberto Powers Understands: Yes Disagree/Comply: Comply Care Advice Given Per Guideline CALL PCP WITHIN 24 HOURS: You need to discuss this with your doctor within the next 24 hours. CARE ADVICE given per Medication Question Call (Adult) guideline. Caller is requesting that the office call him on Monday to schedule appointment to see his physician. He needs to discuss side effects of Clonazepam with the doctor. Causing him to have difficulty waking up in the morning. Nurse did also instruct call to call the office on Monday to request appointment. Referrals REFERRED TO PCP OFFICE

## 2015-12-16 NOTE — Telephone Encounter (Signed)
I think sent to me in error? Advise OV - with PCP if possible.

## 2015-12-16 NOTE — Telephone Encounter (Signed)
Error/ltd ° °

## 2015-12-16 NOTE — Telephone Encounter (Signed)
Noted  

## 2015-12-16 NOTE — Telephone Encounter (Signed)
Pt scheduled  

## 2015-12-16 NOTE — Telephone Encounter (Signed)
Please schedule patient for appointment.

## 2015-12-23 ENCOUNTER — Ambulatory Visit: Payer: BLUE CROSS/BLUE SHIELD | Admitting: Family Medicine

## 2015-12-24 ENCOUNTER — Ambulatory Visit (INDEPENDENT_AMBULATORY_CARE_PROVIDER_SITE_OTHER): Payer: BLUE CROSS/BLUE SHIELD | Admitting: Family Medicine

## 2015-12-24 ENCOUNTER — Encounter: Payer: Self-pay | Admitting: Family Medicine

## 2015-12-24 VITALS — BP 110/60 | HR 80 | Temp 99.0°F | Ht 69.0 in | Wt 169.0 lb

## 2015-12-24 DIAGNOSIS — F411 Generalized anxiety disorder: Secondary | ICD-10-CM | POA: Insufficient documentation

## 2015-12-24 MED ORDER — CLONAZEPAM 0.5 MG PO TABS: 0.5000 mg | ORAL_TABLET | Freq: Three times a day (TID) | ORAL | Status: DC | PRN

## 2015-12-24 NOTE — Progress Notes (Signed)
   Subjective:    Patient ID: Roberto Powers, male    DOB: 21-Mar-1991, 25 y.o.   MRN: [id number]  HPI Here to follow up on anxiety and to discuss meds. In [place] of this year we prescribed him some Clonazepam to use prn. He does use this intermittently and it does relax him, but he finds that if he uses it daily for several weeks he begins to feel a little depressed. He works as an Event organiserindependent contractor and when he is on a job he feels great. He feels motivated and excited and the anxiety seems to disappear. However if he has a break of several months between jobs when he is sitting around his apartment he can feel anxious. He sleeps well and appetite is intact. He asks for advice.    Review of Systems  Constitutional: Negative.   Respiratory: Negative.   Cardiovascular: Negative.   Neurological: Negative.   Psychiatric/Behavioral: Positive for dysphoric mood. Negative for suicidal ideas, behavioral problems, confusion, sleep disturbance, self-injury, decreased concentration and agitation. The patient is nervous/anxious.        Objective:   Physical Exam  Constitutional: He is oriented to person, place, and time. He appears well-developed and well-nourished.  Neck: No thyromegaly present.  Cardiovascular: Normal rate, regular rhythm, normal heart sounds and intact distal pulses.   Pulmonary/Chest: Effort normal and breath sounds normal.  Lymphadenopathy:    He has no cervical adenopathy.  Neurological: He is alert and oriented to person, place, and time.  Psychiatric: He has a normal mood and affect. His behavior is normal. Judgment and thought content normal.          Assessment & Plan:  His anxiety seems to be situational and we decided to keep the Clonazepam as it is. He will soon be headed to another job for a few months and he is getting excited about this. I did warn him that depression can be a side effect of benzodiazepines, and he knows to watch out for this. Follow up  prn. We spent about 40 minutes today discussing these issues.  Nelwyn SalisburyFRY,STEPHEN A, MD

## 2015-12-24 NOTE — Progress Notes (Signed)
Pre visit review using our clinic review tool, if applicable. No additional management support is needed unless otherwise documented below in the visit note. 

## 2016-03-06 ENCOUNTER — Telehealth: Payer: Self-pay | Admitting: Family Medicine

## 2016-03-06 NOTE — Telephone Encounter (Signed)
 [facility] - Client TELEPHONE ADVICE RECORD [facility] Patient Name: Roberto PeaceBRAD Rask DOB: 05-07-1991 Initial Comment Caller says, thinks there is a side effect to a new Rx. He thinks he wet the bed. Nurse Assessment Nurse: Lane HackerHarley, RN, Elvin SoWindy Date/Time Lamount Cohen(Eastern Time): 03/06/2016 12:21:29 PM Confirm and document reason for call. If symptomatic, describe symptoms. You must click the next button to save text entered. ---Caller states that he thinks there is a side effect to a Clonazepam. He's been taking it for almost a year. He thinks he wet the bed for the first time last night. No other problems with urination. He did have a lot of fluids to drink the previous night. He doesn't recall going to the bathroom before he went to bed. He slept well, usually doesn't have a problem waking up to void. Has the patient traveled out of the country within the last 30 days? ---Not Applicable Does the patient have any new or worsening symptoms? ---Yes Will a triage be completed? ---Yes Related visit to physician within the last 2 weeks? ---No Does the PT have any chronic conditions? (i.e. diabetes, asthma, etc.) ---Yes List chronic conditions. ---Tourette's syndrome Is this a behavioral health or substance abuse call? ---No Guidelines Guideline Title Affirmed Question Affirmed Notes Urinary Symptoms All other urine symptoms Final Disposition User See PCP within 2 Mikey CollegeWeeks Harley, RN, Elvin SoWindy Comments He will wait to schedule office visit to see if this persists. RN reassured him that this is not a s.e. from his medication. Caller verb. understanding. (per [facility]) Disagree/Comply: Comply

## 2016-03-06 NOTE — Telephone Encounter (Signed)
Patient wet bed last night. Patient is currently taking Clonazepam (taking for over year) and is concerned may be cause of incident. Per [facility], patient was offered appointment and refused; stated he would call back if problem persists. See details below.

## 2016-07-10 DIAGNOSIS — Z23 Encounter for immunization: Secondary | ICD-10-CM | POA: Diagnosis not present

## 2016-11-20 ENCOUNTER — Other Ambulatory Visit: Payer: Self-pay | Admitting: Family Medicine

## 2016-11-20 NOTE — Telephone Encounter (Signed)
Call in #90 with 2 rf 

## 2017-03-11 ENCOUNTER — Ambulatory Visit (INDEPENDENT_AMBULATORY_CARE_PROVIDER_SITE_OTHER): Payer: BLUE CROSS/BLUE SHIELD | Admitting: Family Medicine

## 2017-03-11 ENCOUNTER — Encounter: Payer: Self-pay | Admitting: Family Medicine

## 2017-03-11 VITALS — BP 101/78 | HR 78 | Temp 98.8°F | Ht 69.0 in | Wt 165.0 lb

## 2017-03-11 DIAGNOSIS — Z0001 Encounter for general adult medical examination with abnormal findings: Secondary | ICD-10-CM | POA: Diagnosis not present

## 2017-03-11 DIAGNOSIS — R7989 Other specified abnormal findings of blood chemistry: Secondary | ICD-10-CM

## 2017-03-11 DIAGNOSIS — L989 Disorder of the skin and subcutaneous tissue, unspecified: Secondary | ICD-10-CM

## 2017-03-11 DIAGNOSIS — Z0000 Encounter for general adult medical examination without abnormal findings: Secondary | ICD-10-CM | POA: Diagnosis not present

## 2017-03-11 LAB — POC URINALSYSI DIPSTICK (AUTOMATED)
Bilirubin, UA: NEGATIVE
Glucose, UA: NEGATIVE
Ketones, UA: NEGATIVE
Leukocytes, UA: NEGATIVE
Nitrite, UA: NEGATIVE
Protein, UA: NEGATIVE
Spec Grav, UA: >=1.03 — AB (ref 1.010–1.025)
Urobilinogen, UA: 0.2 E.U./dL
pH, UA: 6 (ref 5.0–8.0)

## 2017-03-11 LAB — TSH: TSH: 6.07 u[IU]/mL — ABNORMAL HIGH (ref 0.35–4.50)

## 2017-03-11 LAB — CBC WITH DIFFERENTIAL/PLATELET
Basophils Absolute: 0 10*3/uL (ref 0.0–0.1)
Basophils Relative: 0.6 % (ref 0.0–3.0)
Eosinophils Absolute: 0.3 10*3/uL (ref 0.0–0.7)
Eosinophils Relative: 5.2 % — ABNORMAL HIGH (ref 0.0–5.0)
HCT: 42.8 % (ref 39.0–52.0)
Hemoglobin: 14.7 g/dL (ref 13.0–17.0)
Lymphocytes Relative: 35.9 % (ref 12.0–46.0)
Lymphs Abs: 2.1 10*3/uL (ref 0.7–4.0)
MCHC: 34.4 g/dL (ref 30.0–36.0)
MCV: 88.9 fl (ref 78.0–100.0)
Monocytes Absolute: 0.6 10*3/uL (ref 0.1–1.0)
Monocytes Relative: 9.9 % (ref 3.0–12.0)
Neutro Abs: 2.8 10*3/uL (ref 1.4–7.7)
Neutrophils Relative %: 48.4 % (ref 43.0–77.0)
Platelets: 319 10*3/uL (ref 150.0–400.0)
RBC: 4.81 Mil/uL (ref 4.22–5.81)
RDW: 12.6 % (ref 11.5–15.5)
WBC: 5.9 10*3/uL (ref 4.0–10.5)

## 2017-03-11 LAB — BASIC METABOLIC PANEL
BUN: 13 mg/dL (ref 6–23)
CO2: 29 mEq/L (ref 19–32)
Calcium: 9.5 mg/dL (ref 8.4–10.5)
Chloride: 101 mEq/L (ref 96–112)
Creatinine, Ser: 0.86 mg/dL (ref 0.40–1.50)
GFR: 112.63 mL/min (ref >60.00)
Glucose, Bld: 103 mg/dL — ABNORMAL HIGH (ref 70–99)
Potassium: 4.2 mEq/L (ref 3.5–5.1)
Sodium: 138 mEq/L (ref 135–145)

## 2017-03-11 LAB — LIPID PANEL
Cholesterol: 220 mg/dL — ABNORMAL HIGH (ref 0–200)
HDL: 39.8 mg/dL (ref >39.00)
LDL Cholesterol: 149 mg/dL — ABNORMAL HIGH (ref 0–99)
NonHDL: 180.12
Total CHOL/HDL Ratio: 6
Triglycerides: 156 mg/dL — ABNORMAL HIGH (ref 0.0–149.0)
VLDL: 31.2 mg/dL (ref 0.0–40.0)

## 2017-03-11 LAB — HEPATIC FUNCTION PANEL
ALT: 37 U/L (ref 0–53)
AST: 26 U/L (ref 0–37)
Albumin: 4.7 g/dL (ref 3.5–5.2)
Alkaline Phosphatase: 88 U/L (ref 39–117)
Bilirubin, Direct: 0.2 mg/dL (ref 0.0–0.3)
Total Bilirubin: 1.5 mg/dL — ABNORMAL HIGH (ref 0.2–1.2)
Total Protein: 7.6 g/dL (ref 6.0–8.3)

## 2017-03-11 NOTE — Progress Notes (Signed)
   Subjective:    Patient ID: Roberto Powers, male    DOB: 13-May-1991, 26 y.o.   MRN: [id number]  HPI Here for a well exam. He feels well but he asks about a lesion on the abdomen. We looked at this 2 years ago and we did a referral to Dermatology. Unfortunately he never made an appt to go there. He does not think the lesion has changed in appearance at all. 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    Review of Systems  Constitutional: Negative.   HENT: Negative.   Eyes: Negative.   Respiratory: Negative.   Cardiovascular: Negative.   Gastrointestinal: Negative.   Genitourinary: Negative.   Musculoskeletal: Negative.   Skin: Negative.   Neurological: Negative.   Psychiatric/Behavioral: Negative.        Objective:   Physical Exam  Constitutional: He is oriented to person, place, and time. He appears well-developed and well-nourished. No distress.  HENT:  Head: Normocephalic and atraumatic.  Right Ear: External ear normal.  Left Ear: External ear normal.  Nose: Nose normal.  Mouth/Throat: Oropharynx is clear and moist. No oropharyngeal exudate.  Eyes: Pupils are equal, round, and reactive to light. Conjunctivae and EOM are normal. Right eye exhibits no discharge. Left eye exhibits no discharge. No scleral icterus.  Neck: Neck supple. No JVD present. No tracheal deviation present. No thyromegaly present.  Cardiovascular: Normal rate, regular rhythm, normal heart sounds and intact distal pulses.  Exam reveals no gallop and no friction rub.   No murmur heard. Pulmonary/Chest: Effort normal and breath sounds normal. No respiratory distress. He has no wheezes. He has no rales. He exhibits no tenderness.  Abdominal: Soft. Bowel sounds are normal. He exhibits no distension and no mass. There is no tenderness. There is no rebound and no guarding.  Genitourinary: Rectum normal, prostate normal and penis normal. Rectal exam shows guaiac negative  stool. No penile tenderness.  Musculoskeletal: Normal range of motion. He exhibits no edema or tenderness.  Lymphadenopathy:    He has no cervical adenopathy.  Neurological: He is alert and oriented to person, place, and time. He has normal reflexes. No cranial nerve deficit. He exhibits normal muscle tone. Coordination normal.  Skin: Skin is warm and dry. No rash noted. He is not diaphoretic. No erythema. No pallor.  Macular lesion on the lower abdomen which has heterogeneous coloring of pink, tan ,and dark brown   Psychiatric: He has a normal mood and affect. His behavior is normal. Judgment and thought content normal.          Assessment & Plan:  Well exam. We discussed diet and exercise. Get fasting labs. Refer to Dermatology for the skin lesion. He will return next week for a PPD which his job requires.  Gershon Crane, MD

## 2017-03-11 NOTE — Patient Instructions (Signed)
WE NOW OFFER   [facility] FAST TRACK!!!  SAME DAY Appointments for ACUTE CARE  Such as: Sprains, Injuries, cuts, abrasions, rashes, muscle pain, joint pain, back pain Colds, flu, sore throats, headache, allergies, cough, fever  Ear pain, sinus and eye infections Abdominal pain, nausea, vomiting, diarrhea, upset stomach Animal/insect bites  3 Easy Ways to Schedule: Walk-In Scheduling Call in scheduling Mychart Sign-up: [email or web address]         

## 2017-03-15 ENCOUNTER — Ambulatory Visit (INDEPENDENT_AMBULATORY_CARE_PROVIDER_SITE_OTHER): Payer: BLUE CROSS/BLUE SHIELD | Admitting: *Deleted

## 2017-03-15 DIAGNOSIS — Z111 Encounter for screening for respiratory tuberculosis: Secondary | ICD-10-CM

## 2017-03-15 NOTE — Progress Notes (Signed)
Patient here for PPD test per 03/11/17 office visit note. Patient tolerated injection well. Patient instructed to return for reading after 1340 on 03/17/17 and before 1340 on 03/18/17. Patient verbalized understanding of instructions.

## 2017-03-16 ENCOUNTER — Telehealth: Payer: Self-pay | Admitting: Family Medicine

## 2017-03-16 NOTE — Telephone Encounter (Signed)
° ° ° °  Pt call to say he received his lab results on mychart but would like a call back. He said he does not no what they mean

## 2017-03-16 NOTE — Telephone Encounter (Signed)
I spoke with pt and went over results. 

## 2017-03-16 NOTE — Addendum Note (Signed)
Addended by: Gershon Crane A on: 03/16/2017 01:17 PM   Modules accepted: Orders

## 2017-03-17 ENCOUNTER — Encounter: Payer: Self-pay | Admitting: *Deleted

## 2017-03-17 ENCOUNTER — Other Ambulatory Visit (INDEPENDENT_AMBULATORY_CARE_PROVIDER_SITE_OTHER): Payer: BLUE CROSS/BLUE SHIELD

## 2017-03-17 DIAGNOSIS — R7989 Other specified abnormal findings of blood chemistry: Secondary | ICD-10-CM

## 2017-03-17 DIAGNOSIS — R319 Hematuria, unspecified: Secondary | ICD-10-CM

## 2017-03-17 LAB — POCT URINALYSIS DIPSTICK
Bilirubin, UA: NEGATIVE
Glucose, UA: NEGATIVE
Ketones, UA: NEGATIVE
Leukocytes, UA: NEGATIVE
Nitrite, UA: NEGATIVE
Protein, UA: NEGATIVE
Spec Grav, UA: 1.015 (ref 1.010–1.025)
Urobilinogen, UA: 0.2 E.U./dL
pH, UA: 7 (ref 5.0–8.0)

## 2017-03-17 LAB — T3, FREE: T3, Free: 3.6 pg/mL (ref 2.3–4.2)

## 2017-03-17 LAB — TSH: TSH: 4.97 u[IU]/mL — ABNORMAL HIGH (ref 0.35–4.50)

## 2017-03-17 LAB — TB SKIN TEST
Induration: 0 mm
TB Skin Test: NEGATIVE

## 2017-03-17 LAB — T4, FREE: Free T4: 0.79 ng/dL (ref 0.60–1.60)

## 2017-06-28 ENCOUNTER — Encounter: Payer: Self-pay | Admitting: Family Medicine

## 2017-06-28 ENCOUNTER — Ambulatory Visit: Payer: BLUE CROSS/BLUE SHIELD | Admitting: Family Medicine

## 2017-06-28 VITALS — BP 104/70 | HR 83 | Temp 98.7°F | Resp 12 | Ht 69.0 in | Wt 170.4 lb

## 2017-06-28 DIAGNOSIS — J069 Acute upper respiratory infection, unspecified: Secondary | ICD-10-CM

## 2017-06-28 DIAGNOSIS — R05 Cough: Secondary | ICD-10-CM

## 2017-06-28 DIAGNOSIS — R0981 Nasal congestion: Secondary | ICD-10-CM

## 2017-06-28 DIAGNOSIS — R059 Cough, unspecified: Secondary | ICD-10-CM

## 2017-06-28 MED ORDER — FLUTICASONE PROPIONATE 50 MCG/ACT NA SUSP: 1.0000 | Freq: Two times a day (BID) | NASAL | 0 refills | Status: DC

## 2017-06-28 MED ORDER — BENZONATATE 100 MG PO CAPS: 200.0000 mg | ORAL_CAPSULE | Freq: Two times a day (BID) | ORAL | 0 refills | Status: AC | PRN

## 2017-06-28 NOTE — Progress Notes (Signed)
ACUTE VISIT  HPI:  Chief Complaint  Patient presents with  . Cough    Patient stated he has been sick since 06/19/17. has been coughing and some headache  . Headache    Roberto Powers is a 27 y.o.male here today complaining of 9 days of respiratory symptoms.  Fatigue and decreased appetite resolved. He is still coughing, productive with some morning sputum.  He denies hemoptysis. He is also complaining of persistent nasal congestion, which is exacerbated by laying down and alleviated by irrigating the nose with saline.  URI   This is a new problem. The current episode started 1 to 4 weeks ago. The problem has been gradually improving. There has been no fever. Associated symptoms include congestion, coughing, headaches (frontal pressure headache with coughing spells.) and rhinorrhea. Pertinent negatives include no abdominal pain, diarrhea, ear pain, joint swelling, nausea, neck pain, rash, sore throat, swollen glands, vomiting or wheezing. The treatment provided moderate relief.    No Hx of recent travel. No sick contact. No known insect bite.  Hx of allergies: Seasonal allergies.  OTC medications for this problem: DayQuil and NyQuil.   Review of Systems  Constitutional: Negative for activity change, appetite change, chills, fatigue and fever.  HENT: Positive for congestion, postnasal drip, rhinorrhea and sinus pressure. Negative for ear pain, facial swelling, mouth sores, sore throat, trouble swallowing and voice change.   Eyes: Negative for discharge and redness.  Respiratory: Positive for cough. Negative for chest tightness, shortness of breath and wheezing.   Cardiovascular: Negative for leg swelling.  Gastrointestinal: Negative for abdominal pain, diarrhea, nausea and vomiting.  Musculoskeletal: Negative for back pain, myalgias and neck pain.  Skin: Negative for rash.  Allergic/Immunologic: Positive for environmental allergies.  Neurological: Positive for  headaches (frontal pressure headache with coughing spells.). Negative for syncope and weakness.  Hematological: Negative for adenopathy. Does not bruise/bleed easily.  Psychiatric/Behavioral: Negative for confusion. The patient is nervous/anxious.       Current Outpatient Medications on File Prior to Visit  Medication Sig Dispense Refill  . clonazePAM (KLONOPIN) 0.5 MG tablet TAKE 1 TABLET BY MOUTH 3 TIMES A DAY AS NEEDED 90 tablet 2  . ketoconazole (NIZORAL) 2 % cream Apply 1 application topically 2 (two) times daily. (Patient not taking: Reported on 12/24/2015) 30 g 2   No current facility-administered medications on file prior to visit.      History reviewed. No pertinent past medical history. Allergies  Allergen Reactions  . Penicillins     Social History   Socioeconomic History  . Marital status: Married    Spouse name: None  . Number of children: None  . Years of education: None  . Highest education level: None  Social Needs  . Financial resource strain: None  . Food insecurity - worry: None  . Food insecurity - inability: None  . Transportation needs - medical: None  . Transportation needs - non-medical: None  Occupational History  . None  Tobacco Use  . Smoking status: Current Every Day Smoker    Types: E-cigarettes  . Smokeless tobacco: Never Used  . Tobacco comment: trying to stop  Substance and Sexual Activity  . Alcohol use: Yes    Alcohol/week: 0.0 oz    Comment: rare  . Drug use: No  . Sexual activity: None  Other Topics Concern  . None  Social History Narrative  . None    Vitals:   06/28/17 1635  BP: 104/70  Pulse:  83  Resp: 12  Temp: 98.7 F (37.1 C)  SpO2: 98%   Body mass index is 25.16 kg/m.   Physical Exam  Nursing note and vitals reviewed. Constitutional: He is oriented to person, place, and time. He appears well-developed and well-nourished. He does not appear ill. No distress.  HENT:  Head: Normocephalic and atraumatic.    Nose: Rhinorrhea and septal deviation present. Right sinus exhibits no maxillary sinus tenderness and no frontal sinus tenderness. Left sinus exhibits no maxillary sinus tenderness and no frontal sinus tenderness.  Mouth/Throat: Oropharynx is clear and moist and mucous membranes are normal.  Nasal voice. Hypertrophic turbinates. Postnasal drainage.  Eyes: Conjunctivae are normal.  Cardiovascular: Normal rate and regular rhythm.  No murmur heard. Respiratory: Effort normal and breath sounds normal. No respiratory distress.  Lymphadenopathy:       Head (right side): No submandibular adenopathy present.       Head (left side): No submandibular adenopathy present.    He has no cervical adenopathy.  Neurological: He is alert and oriented to person, place, and time. He has normal strength. Gait normal.  Skin: Skin is warm. No rash noted. No erythema.  Psychiatric: He has a normal mood and affect. His speech is normal.  Well groomed, good eye contact.    ASSESSMENT AND PLAN:   Roberto Powers was seen today for cough and headache.  Diagnoses and all orders for this visit:  Viral upper respiratory tract infection  Resolved and now he seems to be having some residual symptoms, most likely viral etiology.  Cough  Explained that after URI cough and nasal congestion could persist for a few days and even weeks, longer if he continues smoking. I do not think imaging is needed at this time. Instructed to monitor for fever or other warning signs.  Honey at bedtime may help. Follow-up as needed.  -     benzonatate (TESSALON) 100 MG capsule; Take 2 capsules (200 mg total) by mouth 2 (two) times daily as needed for up to 10 days for cough.  Nasal sinus congestion  Continue saline nose irrigation as needed. Intranasal steroid, Flonase, recommended bid. OTC decongestants may also help, some side effects discussed. Follow-up as needed.  -     fluticasone (FLONASE) 50 MCG/ACT nasal spray; Place 1  spray into both nostrils 2 (two) times daily.     -Roberto Powers advised to seek attention immediately if symptoms worsen or to follow if they persist or new concerns arise.       Roberto Tunney G. [place], MD  [facility]. [place] office.

## 2017-06-28 NOTE — Patient Instructions (Signed)
  [name] I have seen you today for an acute visit.  A few things to remember from today's visit:   Viral upper respiratory tract infection  Cough - Plan: benzonatate (TESSALON) 100 MG capsule  Nasal sinus congestion - Plan: fluticasone (FLONASE) 50 MCG/ACT nasal spray   Medications prescribed today are intended for short period of time and will not be refill upon request, a follow up appointment might be necessary to discuss continuation of of treatment if appropriate.   viral infections are self-limited and we treat each symptom depending of severity.  Over the counter medications as decongestants and cold medications usually help, they need to be taken with caution if there is a history of high blood pressure or palpitations.  Plenty of fluids. Honey helps with cough. Steam inhalations helps with runny nose, nasal congestion, and may prevent sinus infections. Cough and nasal congestion could last a few days and sometimes weeks. Please follow in not any better in 1-2 weeks or if symptoms get worse.   In general please monitor for signs of worsening symptoms and seek immediate medical attention if any concerning.  If symptoms are not resolved in 2 weeks you should schedule a follow up appointment with your doctor, before if needed.  I hope you get better soon!

## 2017-07-12 ENCOUNTER — Telehealth: Payer: Self-pay

## 2017-07-12 NOTE — Telephone Encounter (Signed)
Last OV with Dr. Clent RidgesFry 03/11/2017 , last seen 06/28/2017 by Betty SwazilandJordan   Rx was last refilled 11/20/2016 disp 90 with 2 refills   Sent to PCP for approval

## 2017-07-12 NOTE — Telephone Encounter (Signed)
Copied from CRM [phone]#[id number]. Topic: General - Other >> Jul 12, 2017  2:08 PM Percival SpanishKennedy, Cheryl W wrote:  Pt cal lto sau he was taking the below med as needed and did not use all his refills so now he need a new RX    clonazePAM (KLONOPIN) 0.5 MG tablet  Pharmacy   [facility] [address]

## 2017-07-13 MED ORDER — CLONAZEPAM 0.5 MG PO TABS: 0.5000 mg | ORAL_TABLET | Freq: Three times a day (TID) | ORAL | 2 refills | Status: DC | PRN

## 2017-07-13 NOTE — Telephone Encounter (Signed)
Rx has been called into pt;s pharmacy. Called and spoke with pt. Pt advised and voiced understanding.

## 2017-07-13 NOTE — Telephone Encounter (Signed)
Call in #90 with 2 rf 

## 2017-10-07 ENCOUNTER — Ambulatory Visit: Payer: BLUE CROSS/BLUE SHIELD | Admitting: Family Medicine

## 2017-10-07 ENCOUNTER — Encounter: Payer: Self-pay | Admitting: Family Medicine

## 2017-10-07 VITALS — BP 116/70 | HR 90 | Temp 99.1°F | Ht 69.0 in | Wt 170.4 lb

## 2017-10-07 DIAGNOSIS — J019 Acute sinusitis, unspecified: Secondary | ICD-10-CM

## 2017-10-07 MED ORDER — AZITHROMYCIN 250 MG PO TABS: ORAL_TABLET | ORAL | 0 refills | Status: DC

## 2017-10-07 NOTE — Progress Notes (Signed)
   Subjective:    Patient ID: Roberto Powers, male    DOB: [birth year], 27 y.o.   MRN: [id number]  HPI Here for 5 days of sinus pressure, PND, and a dry cough. On Zyrtec.    Review of Systems  Constitutional: Negative.   HENT: Positive for congestion, postnasal drip, sinus pressure and sinus pain. Negative for sore throat.   Eyes: Negative.   Respiratory: Positive for cough.        Objective:   Physical Exam  Constitutional: He appears well-developed and well-nourished.  HENT:  Right Ear: External ear normal.  Left Ear: External ear normal.  Nose: Nose normal.  Mouth/Throat: Oropharynx is clear and moist.  Eyes: Conjunctivae are normal.  Neck: No thyromegaly present.  Cardiovascular: Normal rate, regular rhythm, normal heart sounds and intact distal pulses.  Pulmonary/Chest: Effort normal and breath sounds normal. No stridor. No respiratory distress. He has no wheezes. He has no rales.  Lymphadenopathy:    He has no cervical adenopathy.          Assessment & Plan:  Sinusitis, treat with a Zpack.  Gershon Crane, MD

## 2017-10-19 ENCOUNTER — Encounter: Payer: Self-pay | Admitting: Family Medicine

## 2017-10-19 ENCOUNTER — Ambulatory Visit: Payer: BLUE CROSS/BLUE SHIELD | Admitting: Family Medicine

## 2017-10-19 VITALS — BP 110/78 | HR 85 | Temp 98.5°F | Ht 69.0 in | Wt 173.7 lb

## 2017-10-19 DIAGNOSIS — H6983 Other specified disorders of Eustachian tube, bilateral: Secondary | ICD-10-CM | POA: Diagnosis not present

## 2017-10-19 MED ORDER — FLUTICASONE PROPIONATE 50 MCG/ACT NA SUSP: 2.0000 | Freq: Every day | NASAL | 1 refills | Status: DC

## 2017-10-19 NOTE — Patient Instructions (Signed)
Can use afrin for 4 days - then stop. Do not use longer.  Flonase 2 sprays each nostril daily for 1 month, then 1 spray each nostril daily.  Nasal saline and massage as we discussed can also help.  I hope you are feeling better soon! Seek care promptly if your symptoms worsen, new concerns arise or you are not improving with treatment.

## 2017-10-19 NOTE — Progress Notes (Signed)
HPI:  Using dictation device. Unfortunately this device frequently misinterprets words/phrases.   Acute visit for ear issues : -started: the last few weeks -symptoms: ears feel full, occasional cracking when he coughs or yawns, muffled -denies:fever, SOB, NVD, tooth pain, pain in the ears, drainage from the ears, blood from the ears -has tried: Nothing -sick contacts/travel/risks: no reported flu, strep or tick exposure -Hx of: Seasonal allergies and sinus infection a few weeks ago ROS: See pertinent positives and negatives per HPI.  History reviewed. No pertinent past medical history.  History reviewed. No pertinent surgical history.  History reviewed. No pertinent family history.  Social History   Socioeconomic History  . Marital status: Married    Spouse name: Not on file  . Number of children: Not on file  . Years of education: Not on file  . Highest education level: Not on file  Occupational History  . Not on file  Social Needs  . Financial resource strain: Not on file  . Food insecurity:    Worry: Not on file    Inability: Not on file  . Transportation needs:    Medical: Not on file    Non-medical: Not on file  Tobacco Use  . Smoking status: Current Every Day Smoker    Types: E-cigarettes  . Smokeless tobacco: Never Used  . Tobacco comment: trying to stop  Substance and Sexual Activity  . Alcohol use: Yes    Alcohol/week: 0.0 oz    Comment: rare  . Drug use: No  . Sexual activity: Not on file  Lifestyle  . Physical activity:    Days per week: Not on file    Minutes per session: Not on file  . Stress: Not on file  Relationships  . Social connections:    Talks on phone: Not on file    Gets together: Not on file    Attends religious service: Not on file    Active member of club or organization: Not on file    Attends meetings of clubs or organizations: Not on file    Relationship status: Not on file  Other Topics Concern  . Not on file  Social  History Narrative  . Not on file     Current Outpatient Medications:  .  cetirizine (ZYRTEC ALLERGY) 10 MG tablet, Take 10 mg by mouth daily., Disp: , Rfl:  .  clonazePAM (KLONOPIN) 0.5 MG tablet, Take 1 tablet (0.5 mg total) by mouth 3 (three) times daily as needed., Disp: 90 tablet, Rfl: 2 .  fluticasone (FLONASE) 50 MCG/ACT nasal spray, Place 2 sprays into both nostrils daily., Disp: 16 g, Rfl: 1  EXAM:  Vitals:   10/19/17 1630  BP: 110/78  Pulse: 85  Temp: 98.5 F (36.9 C)    Body mass index is 25.65 kg/m.  GENERAL: vitals reviewed and listed above, alert, oriented, appears well hydrated and in no acute distress  HEENT: atraumatic, conjunttiva clear, no obvious abnormalities on inspection of external nose and ears, normal appearance of ear canals and tympanic membranes except for clear effusion on the left, clear nasal congestion, mild post oropharyngeal eryth except for clear effusion on the left, Ema with PND, no tonsillar edema or exudate, no sinus TTP  NECK: no obvious masses on inspection  MS: moves all extremities without noticeable abnormality  PSYCH: pleasant and cooperative, no obvious depression or anxiety  ASSESSMENT AND PLAN:  Discussed the following assessment and plan:  No diagnosis found.  -Clear effusion and eustachian tube  dysfunction secondary to seasonal allergies or recent sinus infection suspected  -Treatment per patient instructions and orders  -of course, we advised to return or notify a doctor immediately if symptoms worsen or persist or new concerns arise.    Patient Instructions  Can use afrin for 4 days - then stop. Do not use longer.  Flonase 2 sprays each nostril daily for 1 month, then 1 spray each nostril daily.  Nasal saline and massage as we discussed can also help.  I hope you are feeling better soon! Seek care promptly if your symptoms worsen, new concerns arise or you are not improving with treatment.     Terressa Koyanagi,  DO

## 2018-01-10 ENCOUNTER — Other Ambulatory Visit: Payer: Self-pay

## 2018-01-10 ENCOUNTER — Telehealth: Payer: Self-pay | Admitting: Family Medicine

## 2018-01-10 MED ORDER — FLUTICASONE PROPIONATE 50 MCG/ACT NA SUSP: 2.0000 | Freq: Every day | NASAL | 1 refills | Status: DC

## 2018-01-10 NOTE — Telephone Encounter (Signed)
Copied from CRM [phone]#[id number]. Topic: Quick Communication - See Telephone Encounter >> Jan 10, 2018 11:22 AM Terisa Starraylor, Brittany L wrote: CRM for notification. See Telephone encounter for: 01/10/18.  Patient said that his pharmacy contacted him to call his pcp to get a new script for fluticasone (FLONASE) 50 MCG/ACT nasal spray.  [id number] [place], [place] - [address] [place] [phone]

## 2018-11-22 ENCOUNTER — Other Ambulatory Visit: Payer: Self-pay

## 2018-11-22 ENCOUNTER — Ambulatory Visit (INDEPENDENT_AMBULATORY_CARE_PROVIDER_SITE_OTHER): Payer: BC Managed Care – PPO | Admitting: Family Medicine

## 2018-11-22 ENCOUNTER — Encounter: Payer: Self-pay | Admitting: Family Medicine

## 2018-11-22 DIAGNOSIS — J3089 Other allergic rhinitis: Secondary | ICD-10-CM

## 2018-11-22 DIAGNOSIS — Z209 Contact with and (suspected) exposure to unspecified communicable disease: Secondary | ICD-10-CM | POA: Diagnosis not present

## 2018-11-22 DIAGNOSIS — F952 Tourette's disorder: Secondary | ICD-10-CM | POA: Diagnosis not present

## 2018-11-22 MED ORDER — FLUTICASONE PROPIONATE 50 MCG/ACT NA SUSP: 2.0000 | Freq: Every day | NASAL | 5 refills | Status: DC

## 2018-11-22 MED ORDER — CLONAZEPAM 1 MG PO TABS: 1.0000 mg | ORAL_TABLET | Freq: Three times a day (TID) | ORAL | 2 refills | Status: DC | PRN

## 2018-11-22 NOTE — Progress Notes (Signed)
   Subjective:    Patient ID: Roberto Powers, male    DOB: 11/29/1990, 28 y.o.   MRN: [id number]  HPI Virtual Visit via Video Note  I connected with the patient on 11/22/18 at  3:15 PM EDT by a video enabled telemedicine application and verified that I am speaking with the correct person using two identifiers.  Location patient: home Location provider:work or home office Persons participating in the virtual visit: patient, provider  I discussed the limitations of evaluation and management by telemedicine and the availability of in person appointments. The patient expressed understanding and agreed to proceed.   HPI: Here for several issues. First his sinus and nasal congestion have returned and he has used Flonase in the past with success. Second he asks to be tested for antibodies to the Covid-19 virus. He has not had any symptoms of this, but he is interested in possibly donating plasma to help Covid patients. Third his Tourette's syndrome has been acting up again and the 0.5 mg dose of Clonazepam has not been working as well as it used to.    ROS: See pertinent positives and negatives per HPI.  History reviewed. No pertinent past medical history.  History reviewed. No pertinent surgical history.  History reviewed. No pertinent family history.   Current Outpatient Medications:  .  clonazePAM (KLONOPIN) 1 MG tablet, Take 1 tablet (1 mg total) by mouth 3 (three) times daily as needed., Disp: 90 tablet, Rfl: 2 .  fluticasone (FLONASE) 50 MCG/ACT nasal spray, Place 2 sprays into both nostrils daily., Disp: 16 g, Rfl: 5  EXAM:  VITALS per patient if applicable:  GENERAL: alert, oriented, appears well and in no acute distress  HEENT: atraumatic, conjunttiva clear, no obvious abnormalities on inspection of external nose and ears  NECK: normal movements of the head and neck  LUNGS: on inspection no signs of respiratory distress, breathing rate appears normal, no obvious gross  SOB, gasping or wheezing  CV: no obvious cyanosis  MS: moves all visible extremities without noticeable abnormality  PSYCH/NEURO: pleasant and cooperative, no obvious depression or anxiety, speech and thought processing grossly intact  ASSESSMENT AND PLAN: For his allergies, get back on Flonase daily. We will set up for Covid-19 IgG testing. For Tourettes we will increase the Clonazepam to 1 mg to use TID as needed.  Alysia Penna, MD  Discussed the following assessment and plan:  Exposure to communicable disease - Plan: SAR CoV2 Serology (COVID 19)AB(IGG)IA     I discussed the assessment and treatment plan with the patient. The patient was provided an opportunity to ask questions and all were answered. The patient agreed with the plan and demonstrated an understanding of the instructions.   The patient was advised to call back or seek an in-person evaluation if the symptoms worsen or if the condition fails to improve as anticipated.     Review of Systems     Objective:   Physical Exam        Assessment & Plan:

## 2018-12-26 ENCOUNTER — Telehealth: Payer: BC Managed Care – PPO | Admitting: Physician Assistant

## 2018-12-26 ENCOUNTER — Encounter: Payer: Self-pay | Admitting: Physician Assistant

## 2018-12-26 DIAGNOSIS — L0390 Cellulitis, unspecified: Secondary | ICD-10-CM

## 2018-12-26 DIAGNOSIS — W57XXXA Bitten or stung by nonvenomous insect and other nonvenomous arthropods, initial encounter: Secondary | ICD-10-CM

## 2018-12-26 NOTE — Progress Notes (Signed)
Based on what you shared with me, I feel your condition warrants further evaluation and I recommend that you be seen for a face to face office visit.  NOTE: If you entered your credit card information for this eVisit, you will not be charged. You may see a "hold" on your card for the $35 but that hold will drop off and you will not have a charge processed.  With possibility of facial skin infection (Erysipelas) you have been advised to have a face to face evaluation of your symptoms.  If you are having a true medical emergency please call 911.     For an urgent face to face visit, [place] has five urgent care centers for your convenience:    [email or web address] to reserve your spot online an avoid wait times  [facility] (New Address!) [address], [place], [place] [address] *Just off [address], across the road from [place] hours of operation: Monday-Friday, 12 PM to 6 PM  Closed Saturday & Sunday   The following sites will take your insurance:  . [facility]    [phone]                  Get Driving Directions  [address], [place] [address] . 10 am to 8 pm Monday-Friday . 12 pm to 8 pm Saturday-Sunday   . [facility] at [place]                  Get Driving Directions  [address], [place], [place] [address] . 8 am to 8 pm Monday-Friday . 9 am to 6 pm Saturday . 11 am to 6 pm Sunday   . [facility] at [place]                  Get Driving Directions   [address], [place] [address] . 8 am to 8 pm Monday-Friday . 8 am to 4 pm Saturday-Sunday    . [facility] at [place]                    Get Driving Directions  [phone]  [address], [place], [place] [address]  . Monday-Friday, 12 PM to 6 PM    Your e-visit answers were  reviewed by a board certified advanced clinical practitioner to complete your personal care plan.  Thank you for using e-Visits. I spent 5-10 minutes on review and completion of this note- Lacy Duverney [facility]

## 2018-12-27 ENCOUNTER — Telehealth: Payer: Self-pay | Admitting: Family Medicine

## 2018-12-27 NOTE — Telephone Encounter (Signed)
Patient sent a mychart message in regards to a bee sting infection. I did talk with the patient today and he says that the bee sting is getting better and that he will look after it. He says that he will give [place] a call back if the bee sting get any worst.

## 2019-04-06 DIAGNOSIS — R5383 Other fatigue: Secondary | ICD-10-CM | POA: Diagnosis not present

## 2019-04-06 DIAGNOSIS — Z20828 Contact with and (suspected) exposure to other viral communicable diseases: Secondary | ICD-10-CM | POA: Diagnosis not present

## 2019-06-28 ENCOUNTER — Ambulatory Visit: Payer: Self-pay | Admitting: *Deleted

## 2019-06-28 DIAGNOSIS — Z20822 Contact with and (suspected) exposure to covid-19: Secondary | ICD-10-CM | POA: Diagnosis not present

## 2019-06-28 NOTE — Telephone Encounter (Signed)
Patient is calling to report his sister works in health care- she had vaccine- but within a matter of days had symptoms of COVID and tested + COVID. Patient wants to now if the test is correct and should he get tested- he does have rapid test scheduled today.  Patient informed the + COVID test should be consider correct- he does fall under exposure protocol- and should get tested at least 5-6 days after exposure- getting tested too soon can give false - COVID result. Will send call to PCP for review and advisement.  Reason for Disposition . [1] CLOSE CONTACT COVID-19 EXPOSURE within last 14 days AND [2] NO symptoms  Answer Assessment - Initial Assessment Questions 1. COVID-19 CLOSE CONTACT: "Who is the person with the confirmed or suspected COVID-19 infection that you were exposed to?"     Sister has tested + COVID 2. PLACE of CONTACT: "Where were you when you were exposed to COVID-19?" (e.g., home, school, medical waiting room; which city?)     Lives with in the home 3. TYPE of CONTACT: "How much contact was there?" (e.g., sitting next to, live in same house, work in same office, same building)     Live in same house 4. DURATION of CONTACT: "How long were you in contact with the COVID-19 patient?" (e.g., a few seconds, passed by person, a few minutes, 15 minutes or longer, live with the patient)     Lives with patient 5. MASK: "Were you wearing a mask?" "Was the other person wearing a mask?" Note: wearing a mask reduces the risk of an otherwise close contact.     no 6. DATE of CONTACT: "When did you have contact with a COVID-19 patient?" (e.g., how many days ago)     Test + 1/18- symptoms 7. COMMUNITY SPREAD: "Are there lots of cases of COVID-19 (community spread) where you live?" (See public health department website, if unsure)       Community spread 8. SYMPTOMS: "Do you have any symptoms?" (e.g., fever, cough, breathing difficulty, loss of taste or smell)     No not at this time 9.  PREGNANCY OR POSTPARTUM: "Is there any chance you are pregnant?" "When was your last menstrual period?" "Did you deliver in the last 2 weeks?"     n/a 10. HIGH RISK: "Do you have any heart or lung problems? Do you have a weak immune system?" (e.g., heart failure, COPD, asthma, HIV positive, chemotherapy, renal failure, diabetes mellitus, sickle cell anemia, obesity)       no 11. TRAVEL: "Have you traveled out of the country recently?" If so, "When and where?" Also ask about out-of-state travel, since the CDC has identified some high-risk cities for community spread in the [place] Note: Travel becomes less relevant if there is widespread community transmission where the patient lives.       no  Protocols used: CORONAVIRUS (COVID-19) EXPOSURE-A-AH

## 2019-06-28 NOTE — Telephone Encounter (Signed)
I agree with him getting tested today

## 2019-06-29 ENCOUNTER — Telehealth: Payer: Self-pay | Admitting: Family Medicine

## 2019-06-29 NOTE — Telephone Encounter (Signed)
Left message for patient to call back. CRM created 

## 2019-06-29 NOTE — Telephone Encounter (Signed)
The patient went to [facility] yesterday and had a rapid test done and it was negative.  I told him that you will give him a call back and he said that he was able to go back to work today because he had the letter stating that it was negative.  He is expecting a call back today to know if Dr. Clent Ridges needs him to quarantine or not.   Please advise

## 2019-06-29 NOTE — Telephone Encounter (Signed)
No need for him to quarantine since he tested negative

## 2019-06-29 NOTE — Telephone Encounter (Signed)
Spoke with patient. He is aware. Nothing further needed.

## 2019-06-30 ENCOUNTER — Other Ambulatory Visit: Payer: Self-pay

## 2019-06-30 ENCOUNTER — Telehealth (INDEPENDENT_AMBULATORY_CARE_PROVIDER_SITE_OTHER): Payer: BC Managed Care – PPO | Admitting: Family Medicine

## 2019-06-30 DIAGNOSIS — Z20822 Contact with and (suspected) exposure to covid-19: Secondary | ICD-10-CM | POA: Diagnosis not present

## 2019-06-30 DIAGNOSIS — Z209 Contact with and (suspected) exposure to unspecified communicable disease: Secondary | ICD-10-CM

## 2019-06-30 NOTE — Progress Notes (Signed)
Virtual Visit via Telephone Note  I connected with the patient on 06/30/19 at 11:30 AM EST by telephone and verified that I am speaking with the correct person using two identifiers.   I discussed the limitations, risks, security and privacy concerns of performing an evaluation and management service by telephone and the availability of in person appointments. I also discussed with the patient that there may be a patient responsible charge related to this service. The patient expressed understanding and agreed to proceed.  Location patient: home Location provider: work or home office Participants present for the call: patient, provider Patient did not have a visit in the prior 7 days to address this/these issue(s).   History of Present Illness: Here with questions about the Covid-19 virus. Roberto Powers lives with his sister, and last week d=she developed fevers, body aches, and a cough. She was tested at an urgent care on 06-26-19, and this returned as positive on 06-28-19. After the the entire family, including Roberto Powers, tested as wella dn they were all negative. Roberto Powers feels fine, but he has been quarantining at home since 06-28-19. He asks about a work excuse for his job. He notes that he is scheduled to take a PCR Covid test tomorrow.    Observations/Objective: Patient sounds cheerful and well on the phone. I do not appreciate any SOB. Speech and thought processing are grossly intact. Patient reported vitals:  Assessment and Plan: He has been exposed to the Covid virus, so I did recommended he quarantine at home for a period of 10 days. This means he can return to work on 07-07-19. We wrote a note for his employer to this effect. However if the test he has tomorrow comes back negative, we will arrange from him to return to work earlier next week.  Gershon Crane, MD    Follow Up Instructions:     [phone] 5-10 [phone] 11-20 [phone] 21-30 I did not refer this patient for an OV in the next 24 hours for  this/these issue(s).  I discussed the assessment and treatment plan with the patient. The patient was provided an opportunity to ask questions and all were answered. The patient agreed with the plan and demonstrated an understanding of the instructions.   The patient was advised to call back or seek an in-person evaluation if the symptoms worsen or if the condition fails to improve as anticipated.  I provided 16 minutes of non-face-to-face time during this encounter.   Gershon Crane, MD

## 2019-07-03 NOTE — Telephone Encounter (Signed)
Patient had virtual visit with Dr. Clent Ridges on 06/30/19 and was advised to get tested.

## 2019-07-12 DIAGNOSIS — H5213 Myopia, bilateral: Secondary | ICD-10-CM | POA: Diagnosis not present

## 2019-08-03 ENCOUNTER — Telehealth: Payer: Self-pay | Admitting: Family Medicine

## 2019-08-03 MED ORDER — CLONAZEPAM 1 MG PO TABS: 1.0000 mg | ORAL_TABLET | Freq: Three times a day (TID) | ORAL | 1 refills | Status: DC | PRN

## 2019-08-03 NOTE — Telephone Encounter (Signed)
Done

## 2019-08-03 NOTE — Telephone Encounter (Signed)
Last filled 11/22/2018 Last OV 06/30/2019  Ok to fill?

## 2019-08-03 NOTE — Telephone Encounter (Signed)
Medication Refill:  Clonazepam  [facility] [address]  Pt stated that he would like a 90-day supply if possible his employer is going through a company change and they are not sure when the new insurance will start and was told to plan for 90 days

## 2019-08-03 NOTE — Telephone Encounter (Signed)
Patient is aware 

## 2019-11-03 ENCOUNTER — Encounter: Payer: Self-pay | Admitting: Family Medicine

## 2019-11-03 ENCOUNTER — Telehealth (INDEPENDENT_AMBULATORY_CARE_PROVIDER_SITE_OTHER): Payer: BC Managed Care – PPO | Admitting: Family Medicine

## 2019-11-03 VITALS — Temp 99.9°F

## 2019-11-03 DIAGNOSIS — Z20828 Contact with and (suspected) exposure to other viral communicable diseases: Secondary | ICD-10-CM | POA: Diagnosis not present

## 2019-11-03 DIAGNOSIS — U071 COVID-19: Secondary | ICD-10-CM

## 2019-11-03 MED ORDER — BENZONATATE 100 MG PO CAPS: 100.0000 mg | ORAL_CAPSULE | Freq: Two times a day (BID) | ORAL | 0 refills | Status: DC | PRN

## 2019-11-03 NOTE — Progress Notes (Signed)
Virtual Visit via Video Note  I connected with Roberto Powers on 11/03/19 at  4:00 PM EDT by a video enabled telemedicine application 2/2 COVID-19 pandemic and verified that I am speaking with the correct person using two identifiers.  Location patient: home Location provider:work or home office Persons participating in the virtual visit: patient, provider  I discussed the limitations of evaluation and management by telemedicine and the availability of in person appointments. The patient expressed understanding and agreed to proceed.   HPI: Pt is a 29 yo male with pmh sig for h/o seasonal allergies, Tourette's d/o, anxiety, migraines.  Pt with post nasal drainage, productive cough, burning in chest, x 4 days.  Initially thought 2/2 allergies however symptoms increased.  Currently with fatigue, chills, and fever T-max 100.80F.   Pt tested positive for COVID today, had a rapid test.  Pt s/p [employer] Covid vaccine in mid April.  Pt went to work in office this week.  Pt's job has closed the office 2/2 pt's positive result.  ROS: See pertinent positives and negatives per HPI.  History reviewed. No pertinent past medical history.  History reviewed. No pertinent surgical history.  History reviewed. No pertinent family history.   Current Outpatient Medications:  .  clonazePAM (KLONOPIN) 1 MG tablet, Take 1 tablet (1 mg total) by mouth 3 (three) times daily as needed., Disp: 270 tablet, Rfl: 1 .  fluticasone (FLONASE) 50 MCG/ACT nasal spray, Place 2 sprays into both nostrils daily., Disp: 16 g, Rfl: 5  EXAM:  VITALS per patient if applicable: RR between 12-20 bpm  GENERAL: alert, oriented, appears well and in no acute distress  HEENT: atraumatic, conjunctiva clear, no obvious abnormalities on inspection of external nose and ears  NECK: normal movements of the head and neck  LUNGS: Intermittent cough. on inspection no signs of respiratory distress, breathing rate appears normal, no  obvious gross SOB, gasping or wheezing  CV: no obvious cyanosis  MS: moves all visible extremities without noticeable abnormality  PSYCH/NEURO: pleasant and cooperative, no obvious depression or anxiety, speech and thought processing grossly intact  ASSESSMENT AND PLAN:  Discussed the following assessment and plan:  COVID-19 virus infection  -Pt with positive rapid test result this afternoon despite [employer] COVID vaccine in April. -Discussed self quarantine -Provided note for work -Given precautions -Patient to continue supportive care: Tylenol, warm fluids, gargling with warm salt water or Chloraseptic spray, OTC cold meds as needed -Pt to notify clinic for worsening symptoms or proceed to the nearest ED. - Plan: benzonatate (TESSALON) 100 MG capsule  Follow-up as needed   I discussed the assessment and treatment plan with the patient. The patient was provided an opportunity to ask questions and all were answered. The patient agreed with the plan and demonstrated an understanding of the instructions.   The patient was advised to call back or seek an in-person evaluation if the symptoms worsen or if the condition fails to improve as anticipated.  Deeann Saint, MD

## 2019-11-13 ENCOUNTER — Other Ambulatory Visit: Payer: Self-pay

## 2019-11-13 ENCOUNTER — Telehealth: Payer: Self-pay | Admitting: Family Medicine

## 2019-11-13 DIAGNOSIS — Z03818 Encounter for observation for suspected exposure to other biological agents ruled out: Secondary | ICD-10-CM | POA: Diagnosis not present

## 2019-11-13 DIAGNOSIS — Z20822 Contact with and (suspected) exposure to covid-19: Secondary | ICD-10-CM | POA: Diagnosis not present

## 2019-11-13 NOTE — Telephone Encounter (Signed)
The patient called to ask about the Dr's note that Dr. Salomon Fick was suppose to send to MyChart from the virtual visit on 11/03/2019. The patient found the note and had questions about the return to work date for 11/15/2019.  The patient had the [employer] and [place] vaccine on 09/13/2019  His employer says 14 days after positive test or until a negative test  CDC says 10 days  Salomon Fick says 12 days  He's just wanting to know what time frame is he suppose to go by.   Please advise

## 2019-11-14 NOTE — Telephone Encounter (Signed)
I always go by the CDC's recommendation which is 10 days after a positive test

## 2019-11-14 NOTE — Telephone Encounter (Signed)
Spoke with the patient. He sated he was already aware of this and that he has already returned to work. Nothing further needed.

## 2020-03-13 ENCOUNTER — Encounter: Payer: Self-pay | Admitting: Family Medicine

## 2020-05-30 ENCOUNTER — Other Ambulatory Visit: Payer: Self-pay

## 2020-05-30 ENCOUNTER — Encounter: Payer: Self-pay | Admitting: Family Medicine

## 2020-05-30 ENCOUNTER — Telehealth (INDEPENDENT_AMBULATORY_CARE_PROVIDER_SITE_OTHER): Payer: BC Managed Care – PPO | Admitting: Family Medicine

## 2020-05-30 DIAGNOSIS — F900 Attention-deficit hyperactivity disorder, predominantly inattentive type: Secondary | ICD-10-CM | POA: Diagnosis not present

## 2020-05-30 MED ORDER — AMPHETAMINE-DEXTROAMPHET ER 10 MG PO CP24: 10.0000 mg | ORAL_CAPSULE | Freq: Every day | ORAL | 0 refills | Status: DC

## 2020-05-30 NOTE — Progress Notes (Signed)
   Subjective:    Patient ID: Roberto Powers, male    DOB: 20-Jun-1990, 29 y.o.   MRN: [id number]  HPI Virtual Visit via Telephone Note  I connected with the patient on 05/30/20 at 10:00 AM EST by telephone and verified that I am speaking with the correct person using two identifiers.   I discussed the limitations, risks, security and privacy concerns of performing an evaluation and management service by telephone and the availability of in person appointments. I also discussed with the patient that there may be a patient responsible charge related to this service. The patient expressed understanding and agreed to proceed.  Location patient: home Location provider: work or home office Participants present for the call: patient, provider Patient did not have a visit in the prior 7 days to address this/these issue(s).   History of Present Illness: Here asking for help with poor focus and easy distractability, especially at his job. He has had trouble with this all his life, but he has never been treated for it. He also tends to start many tasks at once without finishing any of them. His sister has been diagnosed with ADHD, and she takes Adderall. He has tried some of her's, and he says it really helped.    Observations/Objective: Patient sounds cheerful and well on the phone. I do not appreciate any SOB. Speech and thought processing are grossly intact. Patient reported vitals:  Assessment and Plan: Likely ADHD, he will try Adderall XR 10 mg each morning. Recheck here in th office in 3-4 weeks. Gershon Crane, MD   Follow Up Instructions:     [phone] 5-10 [phone] 11-20 [phone] 21-30 I did not refer this patient for an OV in the next 24 hours for this/these issue(s).  I discussed the assessment and treatment plan with the patient. The patient was provided an opportunity to ask questions and all were answered. The patient agreed with the plan and demonstrated an understanding of the  instructions.   The patient was advised to call back or seek an in-person evaluation if the symptoms worsen or if the condition fails to improve as anticipated.  I provided 13 minutes of non-face-to-face time during this encounter.   Gershon Crane, MD    Review of Systems     Objective:   Physical Exam        Assessment & Plan:

## 2020-06-24 ENCOUNTER — Encounter: Payer: BC Managed Care – PPO | Admitting: Family Medicine

## 2020-06-25 ENCOUNTER — Telehealth: Payer: Self-pay | Admitting: Family Medicine

## 2020-06-25 MED ORDER — AMPHETAMINE-DEXTROAMPHET ER 10 MG PO CP24: 10.0000 mg | ORAL_CAPSULE | Freq: Every day | ORAL | 0 refills | Status: DC

## 2020-06-25 NOTE — Telephone Encounter (Signed)
Pt is calling in stating that he was suppose to have an appointment on 06/24/2020 and was called to r/s due to the weather and will be out of Rx amphetamine-dextroamphetamine (ADDERALL XR) 10 MG before his next appointment on 07/08/2020 and would like to see if it can be called in for him.  [facility] [address]

## 2020-06-25 NOTE — Telephone Encounter (Signed)
Done

## 2020-07-08 ENCOUNTER — Other Ambulatory Visit: Payer: Self-pay

## 2020-07-08 ENCOUNTER — Encounter: Payer: Self-pay | Admitting: Family Medicine

## 2020-07-08 ENCOUNTER — Ambulatory Visit (INDEPENDENT_AMBULATORY_CARE_PROVIDER_SITE_OTHER): Payer: BC Managed Care – PPO | Admitting: Family Medicine

## 2020-07-08 VITALS — BP 115/79 | HR 74 | Temp 98.3°F | Resp 18 | Ht 68.0 in | Wt 175.6 lb

## 2020-07-08 DIAGNOSIS — F900 Attention-deficit hyperactivity disorder, predominantly inattentive type: Secondary | ICD-10-CM

## 2020-07-08 DIAGNOSIS — R7989 Other specified abnormal findings of blood chemistry: Secondary | ICD-10-CM | POA: Diagnosis not present

## 2020-07-08 DIAGNOSIS — Z0000 Encounter for general adult medical examination without abnormal findings: Secondary | ICD-10-CM

## 2020-07-08 LAB — CBC WITH DIFFERENTIAL/PLATELET
Basophils Absolute: 0 10*3/uL (ref 0.0–0.1)
Basophils Relative: 0.7 % (ref 0.0–3.0)
Eosinophils Absolute: 0.9 10*3/uL — ABNORMAL HIGH (ref 0.0–0.7)
Eosinophils Relative: 13.5 % — ABNORMAL HIGH (ref 0.0–5.0)
HCT: 43.6 % (ref 39.0–52.0)
Hemoglobin: 15 g/dL (ref 13.0–17.0)
Lymphocytes Relative: 30.3 % (ref 12.0–46.0)
Lymphs Abs: 2 10*3/uL (ref 0.7–4.0)
MCHC: 34.4 g/dL (ref 30.0–36.0)
MCV: 87.5 fl (ref 78.0–100.0)
Monocytes Absolute: 0.4 10*3/uL (ref 0.1–1.0)
Monocytes Relative: 6 % (ref 3.0–12.0)
Neutro Abs: 3.2 10*3/uL (ref 1.4–7.7)
Neutrophils Relative %: 49.5 % (ref 43.0–77.0)
Platelets: 357 10*3/uL (ref 150.0–400.0)
RBC: 4.98 Mil/uL (ref 4.22–5.81)
RDW: 12.5 % (ref 11.5–15.5)
WBC: 6.5 10*3/uL (ref 4.0–10.5)

## 2020-07-08 LAB — HEPATIC FUNCTION PANEL
ALT: 33 U/L (ref 0–53)
AST: 24 U/L (ref 0–37)
Albumin: 4.8 g/dL (ref 3.5–5.2)
Alkaline Phosphatase: 81 U/L (ref 39–117)
Bilirubin, Direct: 0.1 mg/dL (ref 0.0–0.3)
Total Bilirubin: 0.9 mg/dL (ref 0.2–1.2)
Total Protein: 7.8 g/dL (ref 6.0–8.3)

## 2020-07-08 LAB — LIPID PANEL
Cholesterol: 226 mg/dL — ABNORMAL HIGH (ref 0–200)
HDL: 38.6 mg/dL — ABNORMAL LOW (ref >39.00)
LDL Cholesterol: 148 mg/dL — ABNORMAL HIGH (ref 0–99)
NonHDL: 187.7
Total CHOL/HDL Ratio: 6
Triglycerides: 197 mg/dL — ABNORMAL HIGH (ref 0.0–149.0)
VLDL: 39.4 mg/dL (ref 0.0–40.0)

## 2020-07-08 LAB — BASIC METABOLIC PANEL
BUN: 12 mg/dL (ref 6–23)
CO2: 30 mEq/L (ref 19–32)
Calcium: 10.4 mg/dL (ref 8.4–10.5)
Chloride: 102 mEq/L (ref 96–112)
Creatinine, Ser: 0.9 mg/dL (ref 0.40–1.50)
GFR: 114.03 mL/min (ref >60.00)
Glucose, Bld: 97 mg/dL (ref 70–99)
Potassium: 5.2 mEq/L — ABNORMAL HIGH (ref 3.5–5.1)
Sodium: 138 mEq/L (ref 135–145)

## 2020-07-08 LAB — TSH: TSH: 5.18 u[IU]/mL — ABNORMAL HIGH (ref 0.35–4.50)

## 2020-07-08 MED ORDER — AMPHETAMINE-DEXTROAMPHET ER 10 MG PO CP24: 10.0000 mg | ORAL_CAPSULE | Freq: Every day | ORAL | 0 refills | Status: DC

## 2020-07-08 NOTE — Addendum Note (Signed)
Addended by: Lerry Liner on: 07/08/2020 09:51 AM   Modules accepted: Orders

## 2020-07-08 NOTE — Progress Notes (Signed)
Subjective:    Patient ID: KALID GHAN, male    DOB: [birth year], 30 y.o.   MRN: [id number]  HPI Here for a well exam. He feels fine. He recently tried Adderall for ADHD, and this has worked very well for him. He can focus at work and his overall anxiety levels are down. This has also helped with his Tourettes symptoms.    Review of Systems  Constitutional: Negative.   HENT: Negative.   Eyes: Negative.   Respiratory: Negative.   Cardiovascular: Negative.   Gastrointestinal: Negative.   Genitourinary: Negative.   Musculoskeletal: Negative.   Skin: Negative.   Neurological: Negative.   Psychiatric/Behavioral: Negative.        Objective:   Physical Exam Constitutional:      General: He is not in acute distress.    Appearance: He is well-developed and well-nourished. He is not diaphoretic.  HENT:     Head: Normocephalic and atraumatic.     Right Ear: External ear normal.     Left Ear: External ear normal.     Nose: Nose normal.     Mouth/Throat:     Mouth: Oropharynx is clear and moist.     Pharynx: No oropharyngeal exudate.  Eyes:     General: No scleral icterus.       Right eye: No discharge.        Left eye: No discharge.     Extraocular Movements: EOM normal.     Conjunctiva/sclera: Conjunctivae normal.     Pupils: Pupils are equal, round, and reactive to light.  Neck:     Thyroid: No thyromegaly.     Vascular: No JVD.     Trachea: No tracheal deviation.  Cardiovascular:     Rate and Rhythm: Normal rate and regular rhythm.     Pulses: Intact distal pulses.     Heart sounds: Normal heart sounds. No murmur heard. No friction rub. No gallop.   Pulmonary:     Effort: Pulmonary effort is normal. No respiratory distress.     Breath sounds: Normal breath sounds. No wheezing or rales.  Chest:     Chest wall: No tenderness.  Abdominal:     General: Bowel sounds are normal. There is no distension.     Palpations: Abdomen is soft. There is no mass.      Tenderness: There is no abdominal tenderness. There is no guarding or rebound.  Genitourinary:    Penis: Normal. No tenderness.      Testes: Normal.  Musculoskeletal:        General: No tenderness or edema. Normal range of motion.     Cervical back: Neck supple.  Lymphadenopathy:     Cervical: No cervical adenopathy.  Skin:    General: Skin is warm and dry.     Coloration: Skin is not pale.     Findings: No erythema or rash.  Neurological:     Mental Status: He is alert and oriented to person, place, and time.     Cranial Nerves: No cranial nerve deficit.     Motor: No abnormal muscle tone.     Coordination: Coordination normal.     Deep Tendon Reflexes: Reflexes are normal and symmetric. Reflexes normal.  Psychiatric:        Mood and Affect: Mood and affect normal.        Behavior: Behavior normal.        Thought Content: Thought content normal.        Judgment:  Judgment normal.           Assessment & Plan:  Well exam. We discussed diet and exercise. Get fasting labs. Gershon Crane, MD

## 2020-07-10 NOTE — Addendum Note (Signed)
Addended by: Gershon Crane A on: 07/10/2020 07:51 AM   Modules accepted: Orders

## 2020-07-11 NOTE — Progress Notes (Signed)
Mychart message sent: Normal except cholesterol is high and  may have a thyroid problem. Watch the diet closely. Also have set up another lab appt to check a full thyroid panel soon

## 2020-07-22 ENCOUNTER — Other Ambulatory Visit (INDEPENDENT_AMBULATORY_CARE_PROVIDER_SITE_OTHER): Payer: BC Managed Care – PPO

## 2020-07-22 ENCOUNTER — Other Ambulatory Visit: Payer: Self-pay

## 2020-07-22 DIAGNOSIS — R7989 Other specified abnormal findings of blood chemistry: Secondary | ICD-10-CM | POA: Diagnosis not present

## 2020-07-22 DIAGNOSIS — R946 Abnormal results of thyroid function studies: Secondary | ICD-10-CM | POA: Diagnosis not present

## 2020-07-22 LAB — T3, FREE: T3, Free: 3.4 pg/mL (ref 2.3–4.2)

## 2020-07-22 LAB — T4, FREE: Free T4: 0.7 ng/dL (ref 0.60–1.60)

## 2020-07-22 LAB — TSH: TSH: 5.38 u[IU]/mL — ABNORMAL HIGH (ref 0.35–4.50)

## 2020-07-25 ENCOUNTER — Telehealth: Payer: Self-pay | Admitting: Family Medicine

## 2020-07-25 DIAGNOSIS — R7989 Other specified abnormal findings of blood chemistry: Secondary | ICD-10-CM

## 2020-07-25 NOTE — Telephone Encounter (Signed)
Patient is calling for clarification on his labs.  He is requesting that Dr. Clent Ridges be the one to call him.  Please advise.

## 2020-07-26 NOTE — Telephone Encounter (Signed)
He can set up a virtual OV with me if he wishes, but the only question was about his thyroid levels. His TSH is mildly elevated s usual, but the actual levels of free T3 and free T4 are normal. This does not require treatment. sinnce he is conerned about a family hx of thyroid issues, I suggest we repeat a full thyroid panel in 6 months

## 2020-07-26 NOTE — Telephone Encounter (Signed)
Reviewed Dr Claris Che recommendations with patient.  Lab appointment and orders placed

## 2020-09-09 ENCOUNTER — Telehealth: Payer: Self-pay | Admitting: Family Medicine

## 2020-09-09 NOTE — Telephone Encounter (Signed)
LVM for patient to cal back to schedule nurse visit

## 2020-09-09 NOTE — Telephone Encounter (Signed)
Pt call and want Tdap and want a call back because he cut his self with a rusted nail and want a call back.

## 2020-09-11 ENCOUNTER — Ambulatory Visit: Payer: BC Managed Care – PPO

## 2020-09-11 NOTE — Telephone Encounter (Signed)
Nurse visit appointment was scheduled for 09/11/2020, but was cancelled.

## 2020-09-30 ENCOUNTER — Telehealth: Payer: Self-pay | Admitting: Family Medicine

## 2020-09-30 NOTE — Telephone Encounter (Signed)
Last refill- 08/22/20-30 capsules,0 refills Last office visit- 07/08/20  No future visit scheduled

## 2020-09-30 NOTE — Telephone Encounter (Signed)
amphetamine-dextroamphetamine (ADDERALL XR) 10 MG 24 hr capsule(Expired   [id number] [place], [place] - [address] Phone:  [phone]  Fax:  [phone]

## 2020-10-01 MED ORDER — AMPHETAMINE-DEXTROAMPHET ER 10 MG PO CP24: 10.0000 mg | ORAL_CAPSULE | Freq: Every day | ORAL | 0 refills | Status: DC

## 2020-10-01 NOTE — Telephone Encounter (Signed)
Done

## 2020-10-01 NOTE — Addendum Note (Signed)
Addended by: Gershon Crane A on: 10/01/2020 07:51 AM   Modules accepted: Orders

## 2020-10-01 NOTE — Telephone Encounter (Signed)
Pt notified through MyChart portal 

## 2020-10-04 ENCOUNTER — Other Ambulatory Visit: Payer: Self-pay

## 2020-10-04 ENCOUNTER — Telehealth: Payer: Self-pay | Admitting: Family Medicine

## 2020-10-04 MED ORDER — FLUTICASONE PROPIONATE 50 MCG/ACT NA SUSP: 2.0000 | Freq: Every day | NASAL | 1 refills | Status: DC

## 2020-10-04 NOTE — Telephone Encounter (Signed)
[facility]

## 2020-10-04 NOTE — Telephone Encounter (Signed)
Pt call and stated he need a refill on  fluticasone (FLONASE) 50 MCG/ACT nasal spray sent to  [id number] [place], [place] - [address] Phone:  [phone]  Fax:  [phone]     v

## 2020-10-24 ENCOUNTER — Telehealth: Payer: Self-pay | Admitting: Family Medicine

## 2020-10-24 NOTE — Telephone Encounter (Signed)
Patient is calling and stated that he just came back from out of town and accidentally left his Adderrall is his hotel room. Pt wanted to see if provider could send him in another refill , please advise. CB is [phone]   [id number] [place], [place] - [address]  [address], [place] [address]  Phone:  [phone] Fax:  [phone]

## 2020-10-24 NOTE — Telephone Encounter (Signed)
Last refill-10/01/20

## 2020-10-25 MED ORDER — AMPHETAMINE-DEXTROAMPHET ER 10 MG PO CP24: 10.0000 mg | ORAL_CAPSULE | Freq: Every day | ORAL | 0 refills | Status: DC

## 2020-10-25 NOTE — Telephone Encounter (Signed)
I sent in a 10 day supply. He can pick up his regular 30 day refill on May 26.

## 2020-10-25 NOTE — Telephone Encounter (Signed)
Informed patient of message.  

## 2021-01-16 ENCOUNTER — Telehealth: Payer: Self-pay | Admitting: Family Medicine

## 2021-01-16 ENCOUNTER — Encounter: Payer: Self-pay | Admitting: Family Medicine

## 2021-01-16 NOTE — Telephone Encounter (Signed)
PT needs a refill of their amphetamine-dextroamphetamine (ADDERALL XR) 10 MG 24 hr capsule [facility]. PT also states that he would like it if Dr.Fry can do what he did last time and do the 3 month thing where he can just call the Rx and not [place].

## 2021-01-16 NOTE — Telephone Encounter (Signed)
Last refill- 10/25/20--10 cap, 0 refills No future office visit scheduled

## 2021-01-16 NOTE — Telephone Encounter (Signed)
PT called again to followup as they state they do no have any of their meds left and need a a refill asap.

## 2021-01-17 MED ORDER — AMPHETAMINE-DEXTROAMPHET ER 10 MG PO CP24: 10.0000 mg | ORAL_CAPSULE | Freq: Every day | ORAL | 0 refills | Status: DC

## 2021-01-17 NOTE — Addendum Note (Signed)
Addended by: Gershon Crane A on: 01/17/2021 08:05 AM   Modules accepted: Orders

## 2021-01-17 NOTE — Telephone Encounter (Signed)
Done

## 2021-01-17 NOTE — Telephone Encounter (Signed)
Patient sent a my chart message.  Replied via my chart to inform patient medication has been resent.

## 2021-01-20 ENCOUNTER — Other Ambulatory Visit (INDEPENDENT_AMBULATORY_CARE_PROVIDER_SITE_OTHER): Payer: BC Managed Care – PPO

## 2021-01-20 ENCOUNTER — Other Ambulatory Visit: Payer: Self-pay

## 2021-01-20 ENCOUNTER — Encounter: Payer: Self-pay | Admitting: Family Medicine

## 2021-01-20 DIAGNOSIS — R7989 Other specified abnormal findings of blood chemistry: Secondary | ICD-10-CM

## 2021-01-20 LAB — T3, FREE: T3, Free: 3.7 pg/mL (ref 2.3–4.2)

## 2021-01-20 LAB — T4, FREE: Free T4: 0.77 ng/dL (ref 0.60–1.60)

## 2021-01-20 LAB — TSH: TSH: 6.3 u[IU]/mL — ABNORMAL HIGH (ref 0.35–5.50)

## 2021-01-27 ENCOUNTER — Other Ambulatory Visit: Payer: Self-pay

## 2021-01-27 NOTE — Telephone Encounter (Signed)
See my Result Note. I have started him on thyroid replacement

## 2021-01-28 ENCOUNTER — Other Ambulatory Visit: Payer: Self-pay

## 2021-01-28 MED ORDER — LEVOTHYROXINE SODIUM 75 MCG PO TABS: 75.0000 ug | ORAL_TABLET | Freq: Every day | ORAL | 3 refills | Status: DC

## 2021-01-29 NOTE — Telephone Encounter (Signed)
Spoke with pt, verbalized understanding.

## 2021-04-15 ENCOUNTER — Telehealth: Payer: Self-pay

## 2021-04-15 MED ORDER — AMPHETAMINE-DEXTROAMPHET ER 10 MG PO CP24: 10.0000 mg | ORAL_CAPSULE | Freq: Every day | ORAL | 0 refills | Status: DC

## 2021-04-15 MED ORDER — LEVOTHYROXINE SODIUM 75 MCG PO TABS: 75.0000 ug | ORAL_TABLET | Freq: Every day | ORAL | 3 refills | Status: DC

## 2021-04-15 NOTE — Telephone Encounter (Signed)
Patient called stating that he will be losing his insurance coverage by the end of the month and wants to know if there is any way to get 90 day supply refills on levothyroxine (SYNTHROID) 75 MCG tablet amphetamine-dextroamphetamine (ADDERALL XR) 10 MG 24 hr capsule Patient stated he will pay out of pocket for adderall xr

## 2021-04-15 NOTE — Addendum Note (Signed)
Addended by: Gershon Crane A on: 04/15/2021 01:01 PM   Modules accepted: Orders

## 2021-04-15 NOTE — Telephone Encounter (Addendum)
Last Adderall refill- 03/19/21--30 caps, 0 refill Levothyroxine 75mg  -90 day supply currently on file  Last office visit-07/08/20 No future office visit scheduled.   Please advise

## 2021-04-15 NOTE — Telephone Encounter (Signed)
I sent in for 90 Synthroid but I can only do 30 Adderall at a time

## 2021-04-15 NOTE — Telephone Encounter (Addendum)
Spoke with patient, message given.  Nothing further is needed

## 2021-04-22 ENCOUNTER — Telehealth: Payer: Self-pay | Admitting: Family Medicine

## 2021-04-22 NOTE — Telephone Encounter (Signed)
Pt call and stated [facility]  of [facility]derall  because of a shortage sert to  [id number] [place], [place] - [address] Phone:  [phone]  Fax:  [phone]    pt stated they gave him a 20 day supply and stated he will need a 30 day supply on 05/10/21

## 2021-04-23 NOTE — Telephone Encounter (Signed)
Noted. Have him remind [place] shortly before 05-10-21

## 2021-04-24 DIAGNOSIS — E039 Hypothyroidism, unspecified: Secondary | ICD-10-CM

## 2021-04-24 NOTE — Telephone Encounter (Addendum)
Message sent to patient via mychart

## 2021-05-07 ENCOUNTER — Other Ambulatory Visit (INDEPENDENT_AMBULATORY_CARE_PROVIDER_SITE_OTHER): Payer: BC Managed Care – PPO

## 2021-05-07 ENCOUNTER — Telehealth: Payer: Self-pay | Admitting: Family Medicine

## 2021-05-07 DIAGNOSIS — R7989 Other specified abnormal findings of blood chemistry: Secondary | ICD-10-CM

## 2021-05-07 DIAGNOSIS — E039 Hypothyroidism, unspecified: Secondary | ICD-10-CM | POA: Insufficient documentation

## 2021-05-07 LAB — TSH: TSH: 0.89 u[IU]/mL (ref 0.35–5.50)

## 2021-05-07 LAB — T4, FREE: Free T4: 1.1 ng/dL (ref 0.60–1.60)

## 2021-05-07 LAB — T3, FREE: T3, Free: 4.2 pg/mL (ref 2.3–4.2)

## 2021-05-07 NOTE — Addendum Note (Signed)
Addended by: Gershon Crane A on: 05/07/2021 08:06 AM   Modules accepted: Orders

## 2021-05-07 NOTE — Telephone Encounter (Signed)
He will get labs drawn today

## 2021-05-07 NOTE — Telephone Encounter (Signed)
He came in for labs today

## 2021-05-08 ENCOUNTER — Encounter: Payer: Self-pay | Admitting: Family Medicine

## 2021-05-08 ENCOUNTER — Other Ambulatory Visit: Payer: BC Managed Care – PPO

## 2021-05-09 NOTE — Telephone Encounter (Signed)
His levels are exactly where they should be now. He should keep taking the same dose of levothyroxine. Let's check the  levels again in 6 months

## 2021-05-09 NOTE — Telephone Encounter (Signed)
The eosinophil count has nothing to do with the thryoid. This is usually elevated if the person is having some allergy issues. It is nothing to worry about

## 2021-05-26 ENCOUNTER — Telehealth: Payer: Self-pay | Admitting: Family Medicine

## 2021-05-26 MED ORDER — AMPHETAMINE-DEXTROAMPHET ER 10 MG PO CP24: 10.0000 mg | ORAL_CAPSULE | Freq: Every day | ORAL | 0 refills | Status: DC

## 2021-05-26 NOTE — Telephone Encounter (Signed)
Pt is calling and needs refill on  amphetamine-dextroamphetamine (ADDERALL XR) 10 MG 24 hr capsule  [id number] [place], [place] - [address] Phone:  [phone]  Fax:  [phone]

## 2021-05-26 NOTE — Telephone Encounter (Signed)
Last refill- 04/15/21 Last OV- 07/08/20  No future OV scheduled

## 2021-05-26 NOTE — Telephone Encounter (Signed)
Done

## 2021-05-27 NOTE — Telephone Encounter (Signed)
Message was complete on 05/26/21.   Pharmacy will notify patient when ready for pickup.

## 2021-05-27 NOTE — Telephone Encounter (Signed)
Patient called because his prescription for is stating on mychart that it is discontinued until February 2023. I let patient know that prescription should've been sent in already, so call the pharmacy to confirm and I will send a message back so they can look into whether he will get refills for January. Patient stated that he would call to check with pharmacy on if it has been sent.     Please advise

## 2021-05-27 NOTE — Telephone Encounter (Signed)
Called the pharmacy 2x's, attempted to speak with staff, call was sent to voicemail.   Will contact pharmacy again later to check on refill status.

## 2021-05-28 NOTE — Telephone Encounter (Signed)
Called pharmacy again, sent to provider voicemail.

## 2021-07-03 ENCOUNTER — Other Ambulatory Visit: Payer: Self-pay | Admitting: Family Medicine

## 2021-07-03 NOTE — Telephone Encounter (Signed)
Patient is requesting for his medications amphetamine-dextroamphetamine (ADDERALL XR) 10 MG 24 hr capsule [[id number]]  and levothyroxine (SYNTHROID) 75 MCG tablet [[id number]] to be sent to a new pharmacy.   Patient is also requesting for a refill for amphetamine-dextroamphetamine (ADDERALL XR) 10 MG 24 hr capsule [[id number]] to be filled once transferred.  The new pharmacy would be the [facility] on [address] [place], [place], [place] [address].  Patient could be contacted at [phone].  Please advise.

## 2021-07-04 ENCOUNTER — Other Ambulatory Visit: Payer: Self-pay

## 2021-07-04 ENCOUNTER — Encounter: Payer: Self-pay | Admitting: Family Medicine

## 2021-07-04 DIAGNOSIS — R7989 Other specified abnormal findings of blood chemistry: Secondary | ICD-10-CM

## 2021-07-04 MED ORDER — AMPHETAMINE-DEXTROAMPHET ER 10 MG PO CP24: 10.0000 mg | ORAL_CAPSULE | Freq: Every day | ORAL | 0 refills | Status: DC

## 2021-07-04 MED ORDER — LEVOTHYROXINE SODIUM 75 MCG PO TABS: 75.0000 ug | ORAL_TABLET | Freq: Every day | ORAL | 1 refills | Status: DC

## 2021-07-04 NOTE — Telephone Encounter (Signed)
Levothyroxine request has been sent to the pharmacy.    Pharmacy updated.

## 2021-07-04 NOTE — Telephone Encounter (Signed)
Spoke with patient.    He requested that the Adderall XR be sent to [facility] on [address]     Patient also stated currently refill is due now as oppose to Feb 19th that's stated on his chart.    He stated that he has been trying to get his Adderall prescription correct since December and this has became exhausting.

## 2021-07-04 NOTE — Telephone Encounter (Signed)
I sent refills dated today

## 2021-07-04 NOTE — Telephone Encounter (Signed)
Spoke with pt verbalized understanding that Rx for Adderall was sent to his pharmacy with refills

## 2021-07-04 NOTE — Telephone Encounter (Signed)
Patient is requesting a phone call back from [facility] regarding [place] refill.   Please advise.

## 2021-07-04 NOTE — Telephone Encounter (Signed)
I have no idea why his chart says this, please check on it

## 2021-07-04 NOTE — Telephone Encounter (Signed)
He already has refills until March 21

## 2021-07-08 NOTE — Telephone Encounter (Signed)
This encounter has been resolved 

## 2021-08-14 ENCOUNTER — Telehealth: Payer: Self-pay | Admitting: Family Medicine

## 2021-08-14 NOTE — Telephone Encounter (Signed)
Last refill- 07/04/21--30 tabs, 0 refill ?Last OV physical- 07/08/2020 ? ?No future OV scheduled. ? ? ?

## 2021-08-14 NOTE — Telephone Encounter (Signed)
Pt call and stated he need a refill on amphetamine-dextroamphetamine (ADDERALL XR) 10 MG 24 hr capsule sent to  ?[facility] [address] [place], [place] - [address] Phone:  [phone]  ?Fax:  [phone]  ?  ? ?

## 2021-08-15 NOTE — Telephone Encounter (Signed)
Spoke with pt pharamacy state that pt has 2 refills left for his Adderall, advised pharmacy to fill Rx for pt, notified pt ?

## 2021-08-15 NOTE — Telephone Encounter (Signed)
He should have refills until April 26. Please check on this  ?

## 2021-08-15 NOTE — Telephone Encounter (Signed)
Patient called because he is needed a callback because he would like to discuss his adderall  refills. Patient states that the last time he spoke with someone at the end of January, they told him that there was three refills and he would not have to call to get another refill. Patient would like a callback to discuss. ? ? ? ? ?Please advise  ?

## 2021-08-20 NOTE — Telephone Encounter (Signed)
Spoke with pt verbalized understanding that Rx for Adderall was sent to his pharmacy with refills ?

## 2021-09-19 ENCOUNTER — Telehealth: Payer: Self-pay | Admitting: Family Medicine

## 2021-09-19 NOTE — Telephone Encounter (Signed)
Patient needs refill on amphetamine-dextroamphetamine (ADDERALL XR) 10 MG 24 hr capsule ? ? ? ? ?Please send to  ? ?[facility] [address] [place], [place] - [address] Phone:  [phone]  ?Fax:  [phone]  ?  ? ? ? ? ? ?Please advise  ?

## 2021-09-22 NOTE — Telephone Encounter (Signed)
He already has refills to last until 10-01-21. He will need an in person OV for any further refills  ?

## 2021-09-22 NOTE — Telephone Encounter (Signed)
ATC patient, to give message was experiencing phone issues at that time.   Sent patient My Chart message to schedule appointment for future refills.  ?

## 2021-09-24 NOTE — Telephone Encounter (Signed)
Lvm for patient to call office and schedule appointment to be seen. ?

## 2021-10-09 ENCOUNTER — Ambulatory Visit: Payer: Self-pay | Admitting: Family Medicine

## 2021-10-13 ENCOUNTER — Ambulatory Visit (INDEPENDENT_AMBULATORY_CARE_PROVIDER_SITE_OTHER): Payer: Self-pay | Admitting: Family Medicine

## 2021-10-13 ENCOUNTER — Encounter: Payer: Self-pay | Admitting: Family Medicine

## 2021-10-13 ENCOUNTER — Telehealth: Payer: Self-pay | Admitting: Family Medicine

## 2021-10-13 VITALS — BP 118/76 | HR 68 | Temp 98.1°F | Wt 159.0 lb

## 2021-10-13 DIAGNOSIS — F900 Attention-deficit hyperactivity disorder, predominantly inattentive type: Secondary | ICD-10-CM

## 2021-10-13 DIAGNOSIS — E039 Hypothyroidism, unspecified: Secondary | ICD-10-CM

## 2021-10-13 MED ORDER — AMPHETAMINE-DEXTROAMPHET ER 15 MG PO CP24: 15.0000 mg | ORAL_CAPSULE | ORAL | 0 refills | Status: DC

## 2021-10-13 NOTE — Progress Notes (Signed)
? ?  Subjective:  ? ? Patient ID: Roberto Powers, male    DOB: [birth year], 31 y.o.   MRN: [id number] ? ?HPI ?Here to follow up on ADHD. He has been taking Adderall XR 10 mg daily for over a year. He thinks he has adjusted to it a little, and he finds staying focused on his work more difficult. No side effects to report.  ? ? ?Review of Systems  ?Constitutional: Negative.   ?Respiratory: Negative.    ?Cardiovascular: Negative.   ?Neurological: Negative.   ? ?   ?Objective:  ? Physical Exam ?Constitutional:   ?   Appearance: Normal appearance.  ?Cardiovascular:  ?   Rate and Rhythm: Normal rate and regular rhythm.  ?   Pulses: Normal pulses.  ?   Heart sounds: Normal heart sounds.  ?Pulmonary:  ?   Effort: Pulmonary effort is normal.  ?   Breath sounds: Normal breath sounds.  ?Neurological:  ?   Mental Status: He is alert.  ? ? ? ? ? ?   ?Assessment & Plan:  ?ADHD. We will increase the Adderall XR to 15 mg daily. He will report back in 2 weeks.  ?Gershon Crane, MD ? ? ?

## 2021-10-13 NOTE — Telephone Encounter (Signed)
Pt was wondering how much the estimate would be for his lab orders on 5/30 and I let him know we wouldn't know until his orders are placed. Once his orders are placed can I get a msg so I can attempt to provide him with an estimate.  ? ?Please advise.  ?

## 2021-10-14 NOTE — Telephone Encounter (Signed)
Yes, last thyroid levels checked 05/07/21, Tsh, free T3,free T4.    Is okay to only place those orders? ?

## 2021-10-14 NOTE — Telephone Encounter (Signed)
I put in the orders  

## 2021-10-14 NOTE — Telephone Encounter (Signed)
See 05/09/21 telephone encounter.   Please advise which labs to place for patient.  ?

## 2021-10-14 NOTE — Telephone Encounter (Signed)
What labs is her referring to? Thyroid levels ?  ?

## 2021-10-16 ENCOUNTER — Telehealth: Payer: Self-pay

## 2021-10-16 NOTE — Telephone Encounter (Signed)
Pt called and wanted to know the cost of TSH/T4, T3  Free. Advised pt that someone from the office will call him back with information regarding the out of pocket cost for the service ?

## 2021-10-22 NOTE — Telephone Encounter (Signed)
Pt was sent to billing and was told to call the office for the below info ?

## 2021-10-23 ENCOUNTER — Telehealth: Payer: Self-pay | Admitting: Family Medicine

## 2021-10-23 MED ORDER — AMPHETAMINE-DEXTROAMPHET ER 15 MG PO CP24: 15.0000 mg | ORAL_CAPSULE | ORAL | 0 refills | Status: DC

## 2021-10-23 NOTE — Telephone Encounter (Signed)
Pt requests his prescription for amphetamine-dextroamphetamine (ADDERALL XR) 15 MG 24 hr capsule be [facility] to [facility] [address]  [id number]  [phone]. States he only has 2 pills left. Requesting to speak with nurse regarding labs

## 2021-10-23 NOTE — Telephone Encounter (Signed)
Dr. Claris Powers patient.  Please resend [facility] to [facility] [address]. Pharmacy updated.

## 2021-10-24 NOTE — Telephone Encounter (Signed)
Cost of TSH $55 Cost of T4 $32 Cost of T3 $55 Collection cost $12  Pt notified of above costs that were received from coder. Pt aware that prices given are approximate prices & not exact prices. Pt verb understanding.

## 2021-11-04 ENCOUNTER — Other Ambulatory Visit (INDEPENDENT_AMBULATORY_CARE_PROVIDER_SITE_OTHER): Payer: Self-pay

## 2021-11-04 DIAGNOSIS — E039 Hypothyroidism, unspecified: Secondary | ICD-10-CM

## 2021-11-04 LAB — T3, FREE: T3, Free: 3.8 pg/mL (ref 2.3–4.2)

## 2021-11-04 LAB — T4, FREE: Free T4: 0.88 ng/dL (ref 0.60–1.60)

## 2021-11-04 LAB — TSH: TSH: 1.08 u[IU]/mL (ref 0.35–5.50)

## 2021-11-25 ENCOUNTER — Telehealth: Payer: Self-pay | Admitting: Family Medicine

## 2021-11-25 NOTE — Telephone Encounter (Signed)
Patient needs refill on amphetamine-dextroamphetamine (ADDERALL XR) 15 MG 24 hr capsule (Expired)    Please send to  [id number] [place], [place] - [address] Phone:  [phone]  Fax:  [phone]          Please advise

## 2021-11-25 NOTE — Telephone Encounter (Signed)
Last refill- 10/23/21-30 tabs, 0 refills Last OV- 10/13/21

## 2021-11-26 MED ORDER — AMPHETAMINE-DEXTROAMPHET ER 15 MG PO CP24: 15.0000 mg | ORAL_CAPSULE | ORAL | 0 refills | Status: DC

## 2021-11-26 NOTE — Telephone Encounter (Signed)
Pt was notified via MyChart.

## 2021-11-26 NOTE — Telephone Encounter (Signed)
Done

## 2021-12-29 ENCOUNTER — Telehealth: Payer: Self-pay | Admitting: Family Medicine

## 2021-12-29 NOTE — Telephone Encounter (Signed)
Pt requests prescription for  amphetamine-dextroamphetamine (ADDERALL XR) 15 MG 24 hr capsule be transferred to  [facility] [address] [place], [place] - [address] Phone:  [phone]  Fax:  [phone]    Pt requests notification when it is done

## 2021-12-30 MED ORDER — AMPHETAMINE-DEXTROAMPHET ER 15 MG PO CP24: 15.0000 mg | ORAL_CAPSULE | ORAL | 0 refills | Status: DC

## 2021-12-30 NOTE — Telephone Encounter (Signed)
Notified patient of prescription sent to pharmacy.

## 2021-12-30 NOTE — Telephone Encounter (Signed)
Done for 2 months

## 2021-12-30 NOTE — Addendum Note (Signed)
Addended by: Gershon Crane A on: 12/30/2021 12:44 PM   Modules accepted: Orders

## 2022-01-28 ENCOUNTER — Other Ambulatory Visit: Payer: Self-pay | Admitting: Family Medicine

## 2022-01-28 DIAGNOSIS — R7989 Other specified abnormal findings of blood chemistry: Secondary | ICD-10-CM

## 2022-01-29 ENCOUNTER — Telehealth: Payer: Self-pay | Admitting: Family Medicine

## 2022-01-29 NOTE — Telephone Encounter (Signed)
Called [facility], refill was currently on file for Adderall XR 15mg .   Prescription will be filled, and patient will receive text message with prescription is ready for pick up.  Verified patient phone number.

## 2022-01-29 NOTE — Telephone Encounter (Signed)
Refill of amphetamine-dextroamphetamine (ADDERALL XR) 15 MG 24 hr capsule  [facility] [address] [place], [place] - [address] Phone:  [phone]  Fax:  [phone]

## 2022-03-04 ENCOUNTER — Telehealth: Payer: Self-pay | Admitting: Family Medicine

## 2022-03-04 NOTE — Telephone Encounter (Signed)
Refill of  amphetamine-dextroamphetamine (ADDERALL XR) 15 MG 24 hr capsule (Expired)

## 2022-03-05 MED ORDER — AMPHETAMINE-DEXTROAMPHET ER 15 MG PO CP24: 15.0000 mg | ORAL_CAPSULE | ORAL | 0 refills | Status: DC

## 2022-03-05 NOTE — Addendum Note (Signed)
Addended by: Alysia Penna A on: 03/05/2022 12:41 PM   Modules accepted: Orders

## 2022-03-05 NOTE — Telephone Encounter (Signed)
Pt LOV was on 10/13/2021 Last refill for Adderall was done on 01/30/2022 Pt request refill, please advise

## 2022-03-05 NOTE — Telephone Encounter (Signed)
Done

## 2022-04-15 ENCOUNTER — Telehealth: Payer: Self-pay | Admitting: Family Medicine

## 2022-04-15 NOTE — Telephone Encounter (Signed)
Pt call and stated he need a refill on amphetamine-dextroamphetamine (ADDERALL XR) 15 MG 24 hr capsule sent to  [facility] [address] [place], [place] - [address] Phone: [phone]  Fax: [phone]

## 2022-04-16 NOTE — Telephone Encounter (Signed)
He already has refills available until Dec. 28

## 2022-04-16 NOTE — Telephone Encounter (Signed)
Pt LOV was on 10/13/2021 Last refill was done on 03/05/22 Please advise

## 2022-05-05 ENCOUNTER — Telehealth: Payer: Self-pay | Admitting: Nurse Practitioner

## 2022-05-05 DIAGNOSIS — H1033 Unspecified acute conjunctivitis, bilateral: Secondary | ICD-10-CM

## 2022-05-05 DIAGNOSIS — U071 COVID-19: Secondary | ICD-10-CM

## 2022-05-05 MED ORDER — OFLOXACIN 0.3 % OP SOLN: 1.0000 [drp] | Freq: Four times a day (QID) | OPHTHALMIC | 0 refills | Status: AC

## 2022-05-05 NOTE — Progress Notes (Signed)
Virtual Visit Consent   Roberto Powers, you are scheduled for a virtual visit with a [facility] provider today. Just as with appointments in the office, your consent must be obtained to participate. Your consent will be active for this visit and any virtual visit you may have with one of our providers in the next 365 days. If you have a MyChart account, a copy of this consent can be sent to you electronically.  As this is a virtual visit, video technology does not allow for your provider to perform a traditional examination. This may limit your provider's ability to fully assess your condition. If your provider identifies any concerns that need to be evaluated in person or the need to arrange testing (such as labs, EKG, etc.), we will make arrangements to do so. Although advances in technology are sophisticated, we cannot ensure that it will always work on either your end or our end. If the connection with a video visit is poor, the visit may have to be switched to a telephone visit. With either a video or telephone visit, we are not always able to ensure that we have a secure connection.  By engaging in this virtual visit, you consent to the provision of healthcare and authorize for your insurance to be billed (if applicable) for the services provided during this visit. Depending on your insurance coverage, you may receive a charge related to this service.  I need to obtain your verbal consent now. Are you willing to proceed with your visit today? Roberto Powers has provided verbal consent on 05/05/2022 for a virtual visit (video or telephone). Viviano Simas, FNP  Date: 05/05/2022 10:15 AM  Virtual Visit via Video Note   I, Viviano Simas, connected with  Roberto Powers  ([id number], Dec 02, 1990) on 05/05/22 at 10:15 AM EST by a video-enabled telemedicine application and verified that I am speaking with the correct person using two identifiers.  Location: Patient: Virtual Visit Location  Patient: Home Provider: Virtual Visit Location Provider: Home Office   I discussed the limitations of evaluation and management by telemedicine and the availability of in person appointments. The patient expressed understanding and agreed to proceed.    History of Present Illness: Roberto Powers is a 31 y.o. who identifies as a male who was assigned male at birth, and is being seen today after testing positive for COVID.  Fever started 5 days ago  Day prior he has a tickle in throat    Today symptoms include nasal congestion and eye drainage   He has been using OTC decongestants and afrin   He has had COVID in the past without complication  He has been vaccinated J&J + one booster   Overall feels that his congestion is improving and he is getting relief with OTC medications   Problems:  Patient Active Problem List   Diagnosis Date Noted   Hypothyroidism 05/07/2021   ADHD (attention deficit hyperactivity disorder), inattentive type 07/08/2020   Environmental and seasonal allergies 11/22/2018   Anxiety state 12/24/2015   TOURETTE'S DISORDER 12/17/2008   MIGRAINE HEADACHE 12/17/2008    Allergies:  Allergies  Allergen Reactions   Penicillins    Medications:  Synthroid   Observations/Objective: Patient is well-developed, well-nourished in no acute distress.  Resting comfortably  at home.  Head is normocephalic, atraumatic.  No labored breathing.  Speech is clear and coherent with logical content.  Patient is alert and oriented at baseline.    Assessment and Plan: 1. COVID-19  Continue management with OTC medications and isolation as appropriate   2. Acute bacterial conjunctivitis of both eyes  - ofloxacin (OCUFLOX) 0.3 % ophthalmic solution; Place 1 drop into both eyes 4 (four) times daily for 5 days.  Dispense: 5 mL; Refill: 0     Follow Up Instructions: I discussed the assessment and treatment plan with the patient. The patient was provided an opportunity to  ask questions and all were answered. The patient agreed with the plan and demonstrated an understanding of the instructions.  A copy of instructions were sent to the patient via MyChart unless otherwise noted below.    The patient was advised to call back or seek an in-person evaluation if the symptoms worsen or if the condition fails to improve as anticipated.  Time:  I spent 10 minutes with the patient via telehealth technology discussing the above problems/concerns.    Viviano Simas, FNP

## 2022-05-15 ENCOUNTER — Telehealth: Payer: Self-pay | Admitting: Physician Assistant

## 2022-05-15 DIAGNOSIS — J019 Acute sinusitis, unspecified: Secondary | ICD-10-CM

## 2022-05-15 DIAGNOSIS — B9689 Other specified bacterial agents as the cause of diseases classified elsewhere: Secondary | ICD-10-CM

## 2022-05-15 MED ORDER — DOXYCYCLINE HYCLATE 100 MG PO TABS: 100.0000 mg | ORAL_TABLET | Freq: Two times a day (BID) | ORAL | 0 refills | Status: DC

## 2022-05-15 NOTE — Patient Instructions (Signed)
Gaetano Hawthorne, thank you for joining Margaretann Loveless, PA-C for today's virtual visit.  While this provider is not your primary care provider (PCP), if your PCP is located in our provider database this encounter information will be shared with them immediately following your visit.   A [facility] MyChart account gives you access to today's visit and all your visits, tests, and labs performed at [facility] " click here if you don't have a [facility] MyChart account or go to [email or web address]  Consent: (Patient) NASEAN ZAPF provided verbal consent for this virtual visit at the beginning of the encounter.  Current Medications:  Current Outpatient Medications:    doxycycline (VIBRA-TABS) 100 MG tablet, Take 1 tablet (100 mg total) by mouth 2 (two) times daily., Disp: 20 tablet, Rfl: 0   amphetamine-dextroamphetamine (ADDERALL XR) 15 MG 24 hr capsule, Take 1 capsule by mouth every morning., Disp: 30 capsule, Rfl: 0   clonazePAM (KLONOPIN) 1 MG tablet, Take 1 tablet (1 mg total) by mouth 3 (three) times daily as needed., Disp: 270 tablet, Rfl: 1   fluticasone (FLONASE) 50 MCG/ACT nasal spray, Place 2 sprays into both nostrils daily., Disp: 16 g, Rfl: 1   levothyroxine (SYNTHROID) 75 MCG tablet, TAKE 1 TABLET BY MOUTH ONCE DAILY BEFORE BREAKFAST, Disp: 90 tablet, Rfl: 1   Medications ordered in this encounter:  Meds ordered this encounter  Medications   doxycycline (VIBRA-TABS) 100 MG tablet    Sig: Take 1 tablet (100 mg total) by mouth 2 (two) times daily.    Dispense:  20 tablet    Refill:  0    Order Specific Question:   Supervising Provider    Answer:   Merrilee Jansky [id number]     *If you need refills on other medications prior to your next appointment, please contact your pharmacy*  Follow-Up: Call back or seek an in-person evaluation if the symptoms worsen or if the condition fails to improve as anticipated.   Virtual Care [phone]  Other Instructions Sinus Infection, Adult A sinus infection, also called sinusitis, is inflammation of your sinuses. Sinuses are hollow spaces in the bones around your face. Your sinuses are located: Around your eyes. In the middle of your forehead. Behind your nose. In your cheekbones. Mucus normally drains out of your sinuses. When your nasal tissues become inflamed or swollen, mucus can become trapped or blocked. This allows bacteria, viruses, and fungi to grow, which leads to infection. Most infections of the sinuses are caused by a virus. A sinus infection can develop quickly. It can last for up to 4 weeks (acute) or for more than 12 weeks (chronic). A sinus infection often develops after a cold. What are the causes? This condition is caused by anything that creates swelling in the sinuses or stops mucus from draining. This includes: Allergies. Asthma. Infection from bacteria or viruses. Deformities or blockages in your nose or sinuses. Abnormal growths in the nose (nasal polyps). Pollutants, such as chemicals or irritants in the air. Infection from fungi. This is rare. What increases the risk? You are more likely to develop this condition if you: Have a weak body defense system (immune system). Do a lot of swimming or diving. Overuse nasal sprays. Smoke. What are the signs or symptoms? The main symptoms of this condition are pain and a feeling of pressure around the affected sinuses. Other symptoms include: Stuffy nose or congestion that makes it difficult to breathe through your nose. Thick yellow  or greenish drainage from your nose. Tenderness, swelling, and warmth over the affected sinuses. A cough that may get worse at night. Decreased sense of smell and taste. Extra mucus that collects in the throat or the back of the nose (postnasal drip) causing a sore throat or bad breath. Tiredness (fatigue). Fever. How is this diagnosed? This condition is diagnosed based  on: Your symptoms. Your medical history. A physical exam. Tests to find out if your condition is acute or chronic. This may include: Checking your nose for nasal polyps. Viewing your sinuses using a device that has a light (endoscope). Testing for allergies or bacteria. Imaging tests, such as an MRI or CT scan. In rare cases, a bone biopsy may be done to rule out more serious types of fungal sinus disease. How is this treated? Treatment for a sinus infection depends on the cause and whether your condition is chronic or acute. If caused by a virus, your symptoms should go away on their own within 10 days. You may be given medicines to relieve symptoms. They include: Medicines that shrink swollen nasal passages (decongestants). A spray that eases inflammation of the nostrils (topical intranasal corticosteroids). Rinses that help get rid of thick mucus in your nose (nasal saline washes). Medicines that treat allergies (antihistamines). Over-the-counter pain relievers. If caused by bacteria, your health care provider may recommend waiting to see if your symptoms improve. Most bacterial infections will get better without antibiotic medicine. You may be given antibiotics if you have: A severe infection. A weak immune system. If caused by narrow nasal passages or nasal polyps, surgery may be needed. Follow these instructions at home: Medicines Take, use, or apply over-the-counter and prescription medicines only as told by your health care provider. These may include nasal sprays. If you were prescribed an antibiotic medicine, take it as told by your health care provider. Do not stop taking the antibiotic even if you start to feel better. Hydrate and humidify  Drink enough fluid to keep your urine pale yellow. Staying hydrated will help to thin your mucus. Use a cool mist humidifier to keep the humidity level in your home above 50%. Inhale steam for 10-15 minutes, 3-4 times a day, or as told by  your health care provider. You can do this in the bathroom while a hot shower is running. Limit your exposure to cool or dry air. Rest Rest as much as possible. Sleep with your head raised (elevated). Make sure you get enough sleep each night. General instructions  Apply a warm, moist washcloth to your face 3-4 times a day or as told by your health care provider. This will help with discomfort. Use nasal saline washes as often as told by your health care provider. Wash your hands often with soap and water to reduce your exposure to germs. If soap and water are not available, use hand sanitizer. Do not smoke. Avoid being around people who are smoking (secondhand smoke). Keep all follow-up visits. This is important. Contact a health care provider if: You have a fever. Your symptoms get worse. Your symptoms do not improve within 10 days. Get help right away if: You have a severe headache. You have persistent vomiting. You have severe pain or swelling around your face or eyes. You have vision problems. You develop confusion. Your neck is stiff. You have trouble breathing. These symptoms may be an emergency. Get help right away. Call 911. Do not wait to see if the symptoms will go away. Do not drive  yourself to the hospital. Summary A sinus infection is soreness and inflammation of your sinuses. Sinuses are hollow spaces in the bones around your face. This condition is caused by nasal tissues that become inflamed or swollen. The swelling traps or blocks the flow of mucus. This allows bacteria, viruses, and fungi to grow, which leads to infection. If you were prescribed an antibiotic medicine, take it as told by your health care provider. Do not stop taking the antibiotic even if you start to feel better. Keep all follow-up visits. This is important. This information is not intended to replace advice given to you by your health care provider. Make sure you discuss any questions you have  with your health care provider. Document Revised: 04/29/2021 Document Reviewed: 04/29/2021 Elsevier Patient Education  2023 Elsevier Inc.    If you have been instructed to have an in-person evaluation today at a local Urgent Care facility, please use the link below. It will take you to a list of all of our available [facility], including address, phone number and hours of operation. Please do not delay care.  [facility]  If you or a family member do not have a primary care provider, use the link below to schedule a visit and establish care. When you choose a [facility] primary care physician or advanced practice provider, you gain a long-term partner in health. Find a Primary Care Provider  Learn more about [facility] in-office and virtual care options: [facility] - Get Care Now

## 2022-05-15 NOTE — Progress Notes (Signed)
Virtual Visit Consent   Roberto Powers, you are scheduled for a virtual visit with a [facility] provider today. Just as with appointments in the office, your consent must be obtained to participate. Your consent will be active for this visit and any virtual visit you may have with one of our providers in the next 365 days. If you have a MyChart account, a copy of this consent can be sent to you electronically.  As this is a virtual visit, video technology does not allow for your provider to perform a traditional examination. This may limit your provider's ability to fully assess your condition. If your provider identifies any concerns that need to be evaluated in person or the need to arrange testing (such as labs, EKG, etc.), we will make arrangements to do so. Although advances in technology are sophisticated, we cannot ensure that it will always work on either your end or our end. If the connection with a video visit is poor, the visit may have to be switched to a telephone visit. With either a video or telephone visit, we are not always able to ensure that we have a secure connection.  By engaging in this virtual visit, you consent to the provision of healthcare and authorize for your insurance to be billed (if applicable) for the services provided during this visit. Depending on your insurance coverage, you may receive a charge related to this service.  I need to obtain your verbal consent now. Are you willing to proceed with your visit today? Roberto Powers has provided verbal consent on 05/15/2022 for a virtual visit (video or telephone). Margaretann Loveless, PA-C  Date: 05/15/2022 12:26 PM  Virtual Visit via Video Note   I, Margaretann Loveless, connected with  Roberto Powers  ([id number], [birth year]) on 05/15/22 at 12:15 PM EST by a video-enabled telemedicine application and verified that I am speaking with the correct person using two identifiers.  Location: Patient: Virtual Visit  Location Patient: Home Provider: Virtual Visit Location Provider: Home Office   I discussed the limitations of evaluation and management by telemedicine and the availability of in person appointments. The patient expressed understanding and agreed to proceed.    History of Present Illness: Roberto Powers is a 31 y.o. who identifies as a male who was assigned male at birth, and is being seen today for continued URI symptoms following Covid 19.  HPI: URI  This is a new problem. The current episode started 1 to 4 weeks ago (Had Covid 16 days ago and still having URI symptoms). The problem has been gradually worsening. There has been no fever. Associated symptoms include congestion, coughing, headaches, a plugged ear sensation (at night), rhinorrhea, sinus pain, a sore throat and swollen glands. Pertinent negatives include no diarrhea, ear pain, nausea, neck pain or vomiting. Associated symptoms comments: Conjunctivitis. He has tried acetaminophen, NSAIDs and increased fluids (chloraseptic spray, halls, vicks) for the symptoms. The treatment provided no relief.     Problems:  Patient Active Problem List   Diagnosis Date Noted   Hypothyroidism 05/07/2021   ADHD (attention deficit hyperactivity disorder), inattentive type 07/08/2020   Environmental and seasonal allergies 11/22/2018   Anxiety state 12/24/2015   TOURETTE'S DISORDER 12/17/2008   MIGRAINE HEADACHE 12/17/2008    Allergies:  Allergies  Allergen Reactions   Penicillins    Medications:  Current Outpatient Medications:    doxycycline (VIBRA-TABS) 100 MG tablet, Take 1 tablet (100 mg total) by mouth 2 (two) times daily.,  Disp: 20 tablet, Rfl: 0   amphetamine-dextroamphetamine (ADDERALL XR) 15 MG 24 hr capsule, Take 1 capsule by mouth every morning., Disp: 30 capsule, Rfl: 0   clonazePAM (KLONOPIN) 1 MG tablet, Take 1 tablet (1 mg total) by mouth 3 (three) times daily as needed., Disp: 270 tablet, Rfl: 1   fluticasone (FLONASE) 50  MCG/ACT nasal spray, Place 2 sprays into both nostrils daily., Disp: 16 g, Rfl: 1   levothyroxine (SYNTHROID) 75 MCG tablet, TAKE 1 TABLET BY MOUTH ONCE DAILY BEFORE BREAKFAST, Disp: 90 tablet, Rfl: 1  Observations/Objective: Patient is well-developed, well-nourished in no acute distress.  Resting comfortably at home.  Head is normocephalic, atraumatic.  No labored breathing.  Speech is clear and coherent with logical content.  Patient is alert and oriented at baseline.    Assessment and Plan: 1. Acute bacterial sinusitis - doxycycline (VIBRA-TABS) 100 MG tablet; Take 1 tablet (100 mg total) by mouth 2 (two) times daily.  Dispense: 20 tablet; Refill: 0  - Worsening symptoms that have not responded to OTC medications.  - Will give Doxycycline - Continue allergy medications.  - Steam and humidifier can help - Stay well hydrated and get plenty of rest.  - Seek in person evaluation if no symptom improvement or if symptoms worsen   Follow Up Instructions: I discussed the assessment and treatment plan with the patient. The patient was provided an opportunity to ask questions and all were answered. The patient agreed with the plan and demonstrated an understanding of the instructions.  A copy of instructions were sent to the patient via MyChart unless otherwise noted below.    The patient was advised to call back or seek an in-person evaluation if the symptoms worsen or if the condition fails to improve as anticipated.  Time:  I spent 13 minutes with the patient via telehealth technology discussing the above problems/concerns.    Margaretann Loveless, PA-C

## 2022-05-27 ENCOUNTER — Ambulatory Visit: Payer: Self-pay | Admitting: Family Medicine

## 2022-06-20 ENCOUNTER — Telehealth: Payer: Self-pay | Admitting: Nurse Practitioner

## 2022-06-20 ENCOUNTER — Encounter: Payer: Self-pay | Admitting: Nurse Practitioner

## 2022-06-20 DIAGNOSIS — H66009 Acute suppurative otitis media without spontaneous rupture of ear drum, unspecified ear: Secondary | ICD-10-CM

## 2022-06-20 MED ORDER — CIPROFLOXACIN-DEXAMETHASONE 0.3-0.1 % OT SUSP: 4.0000 [drp] | Freq: Two times a day (BID) | OTIC | 0 refills | Status: AC

## 2022-06-20 MED ORDER — AZITHROMYCIN 250 MG PO TABS: ORAL_TABLET | ORAL | 0 refills | Status: AC

## 2022-06-20 NOTE — Progress Notes (Signed)
Also scheduled an e-visit duplicate visit   Already charged for e-visit

## 2022-06-20 NOTE — Progress Notes (Signed)
E-Visit for Ear Pain - Acute Otitis Media   We are sorry that you are not feeling well. Here is how we plan to help!  Based on what you have shared with me it looks like you have Acute Otitis Media.  Acute Otitis Media is an infection of the middle or "inner" ear. This type of infection can cause redness, inflammation, and fluid buildup behind the tympanic membrane (ear drum).  The usual symptoms include: Earache/Pain Fever Upper respiratory symptoms Lack of energy/Fatigue/Malaise Slight hearing loss gradually worsening- if the inner ear fills with fluid What causes middle ear infections? Most middle ear infections occur when an infection such as a cold, leads to a build-up of mucus in the middle ear and causes the Eustachian tube (a thin tube that runs from the middle ear to the back of the nose) to become swollen or blocked.   This means mucus can't drain away properly, making it easier for an infection to spread into the middle ear.  How middle ear infections are treated: Most ear infections clear up within three to five days and don't need any specific treatment. If necessary, tylenol or ibuprofen should be used to relieve pain and a high temperature.  If you develop a fever higher than 102, or any significantly worsening symptoms, this could indicate a more serious infection moving to the middle/inner and needs face to face evaluation in an office by a provider.   Antibiotics aren't routinely used to treat middle ear infections, although they may occasionally be prescribed if symptoms persist or are particularly severe. Given your presentation,   I have prescribed: Meds ordered this encounter  Medications   azithromycin (ZITHROMAX) 250 MG tablet    Sig: Take 2 tablets on day 1, then 1 tablet daily on days 2 through 5    Dispense:  6 tablet    Refill:  0   ciprofloxacin-dexamethasone (CIPRODEX) OTIC suspension    Sig: Place 4 drops into the left ear 2 (two) times daily for 7 days.     Dispense:  7.5 mL    Refill:  0      Your symptoms should improve over the next 3 days and should resolve in about 7 days. Be sure to complete ALL of the prescription(s) given.  HOME CARE: Wash your hands frequently. If you are prescribed an ear drop, do not place the tip of the bottle on your ear or touch it with your fingers. You can take Acetaminophen 650 mg every 4-6 hours as needed for pain.  If pain is severe or moderate, you can apply a heating pad (set on low) or hot water bottle (wrapped in a towel) to outer ear for 20 minutes.  This will also increase drainage.  GET HELP RIGHT AWAY IF: Fever is over 102.2 degrees. You develop progressive ear pain or hearing loss. Ear symptoms persist longer than 3 days after treatment.  MAKE SURE YOU: Understand these instructions. Will watch your condition. Will get help right away if you are not doing well or get worse.  Thank you for choosing an e-visit.  Your e-visit answers were reviewed by a board certified advanced clinical practitioner to complete your personal care plan. Depending upon the condition, your plan could have included both over the counter or prescription medications.  Please review your pharmacy choice. Make sure the pharmacy is open so you can pick up the prescription now. If there is a problem, you may contact your provider through [employer] and  have the prescription routed to another pharmacy.  Your safety is important to [place]. If you have drug allergies check your prescription carefully.   For the next 24 hours you can use MyChart to ask questions about today's visit, request a non-urgent call back, or ask for a work or school excuse. You will get an email with a survey after your eVisit asking about your experience. We would appreciate your feedback. I hope that your e-visit has been valuable and will aid in your recovery.  I spent approximately 5 minutes reviewing the patient's history, current symptoms  and coordinating their care today.

## 2022-10-02 ENCOUNTER — Telehealth: Payer: Self-pay | Admitting: Family Medicine

## 2022-10-02 DIAGNOSIS — J301 Allergic rhinitis due to pollen: Secondary | ICD-10-CM

## 2022-10-02 MED ORDER — PREDNISONE 10 MG (21) PO TBPK: ORAL_TABLET | ORAL | 0 refills | Status: DC

## 2022-10-02 MED ORDER — PROMETHAZINE-DM 6.25-15 MG/5ML PO SYRP: 5.0000 mL | ORAL_SOLUTION | Freq: Four times a day (QID) | ORAL | 0 refills | Status: DC | PRN

## 2022-10-02 NOTE — Addendum Note (Signed)
Addended by: Margaretann Loveless on: 10/02/2022 03:56 PM   Modules accepted: Orders

## 2022-10-02 NOTE — Progress Notes (Signed)
E visit for Allergic Rhinitis We are sorry that you are not feeling well.  Here is how we plan to help!  Based on what you have shared with me it looks like you have Allergic Rhinitis.  Rhinitis is when a reaction occurs that causes nasal congestion, runny nose, sneezing, and itching.  Most types of rhinitis are caused by an inflammation and are associated with symptoms in the eyes ears or throat. There are several types of rhinitis.  The most common are acute rhinitis, which is usually caused by a viral illness, allergic or seasonal rhinitis, and nonallergic or year-round rhinitis.  Nasal allergies occur certain times of the year.  Allergic rhinitis is caused when allergens in the air trigger the release of histamine in the body.  Histamine causes itching, swelling, and fluid to build up in the fragile linings of the nasal passages, sinuses and eyelids.  An itchy nose and clear discharge are common.  I recommend the following over the counter treatments: You should take a daily dose of antihistamine  I also would recommend a nasal spray: Flonase 2 sprays into each nostril once daily  You may also benefit from eye drops such as: Visine 1-2 drops each eye twice daily as needed  I will also send Prednisone 10 mg tapering dose pack to help with symptoms.   HOME CARE:  You can use an over-the-counter saline nasal spray as needed Avoid areas where there is heavy dust, mites, or molds Stay indoors on windy days during the pollen season Keep windows closed in home, at least in bedroom; use air conditioner. Use high-efficiency house air filter Keep windows closed in car, turn AC on re-circulate Avoid playing out with dog during pollen season  GET HELP RIGHT AWAY IF:  If your symptoms do not improve within 10 days You become short of breath You develop yellow or green discharge from your nose for over 3 days You have coughing fits  MAKE SURE YOU:  Understand these instructions Will watch  your condition Will get help right away if you are not doing well or get worse  Thank you for choosing an e-visit. Your e-visit answers were reviewed by a board certified advanced clinical practitioner to complete your personal care plan. Depending upon the condition, your plan could have included both over the counter or prescription medications. Please review your pharmacy choice. Be sure that the pharmacy you have chosen is open so that you can pick up your prescription now.  If there is a problem you may message your provider in MyChart to have the prescription routed to another pharmacy. Your safety is important to [place]. If you have drug allergies check your prescription carefully.  For the next 24 hours, you can use MyChart to ask questions about today's visit, request a non-urgent call back, or ask for a work or school excuse from your e-visit provider. You will get an email in the next two days asking about your experience. I hope that your e-visit has been valuable and will speed your recovery.   have provided 5 minutes of non face to face time during this encounter for chart review and documentation.

## 2022-10-12 ENCOUNTER — Encounter: Payer: Self-pay | Admitting: Family Medicine

## 2022-10-12 ENCOUNTER — Ambulatory Visit (INDEPENDENT_AMBULATORY_CARE_PROVIDER_SITE_OTHER): Payer: Self-pay | Admitting: Family Medicine

## 2022-10-12 VITALS — BP 110/78 | HR 59 | Temp 98.1°F | Ht 68.0 in | Wt 173.8 lb

## 2022-10-12 DIAGNOSIS — J3089 Other allergic rhinitis: Secondary | ICD-10-CM

## 2022-10-12 DIAGNOSIS — Z0000 Encounter for general adult medical examination without abnormal findings: Secondary | ICD-10-CM

## 2022-10-12 DIAGNOSIS — R7989 Other specified abnormal findings of blood chemistry: Secondary | ICD-10-CM

## 2022-10-12 DIAGNOSIS — D75839 Thrombocytosis, unspecified: Secondary | ICD-10-CM

## 2022-10-12 DIAGNOSIS — E039 Hypothyroidism, unspecified: Secondary | ICD-10-CM

## 2022-10-12 LAB — HEPATIC FUNCTION PANEL
ALT: 57 U/L — ABNORMAL HIGH (ref 0–53)
AST: 34 U/L (ref 0–37)
Albumin: 4.2 g/dL (ref 3.5–5.2)
Alkaline Phosphatase: 85 U/L (ref 39–117)
Bilirubin, Direct: 0.1 mg/dL (ref 0.0–0.3)
Total Bilirubin: 0.7 mg/dL (ref 0.2–1.2)
Total Protein: 7.3 g/dL (ref 6.0–8.3)

## 2022-10-12 LAB — BASIC METABOLIC PANEL
BUN: 13 mg/dL (ref 6–23)
CO2: 27 mEq/L (ref 19–32)
Calcium: 9.1 mg/dL (ref 8.4–10.5)
Chloride: 101 mEq/L (ref 96–112)
Creatinine, Ser: 0.89 mg/dL (ref 0.40–1.50)
GFR: 112.62 mL/min (ref >60.00)
Glucose, Bld: 80 mg/dL (ref 70–99)
Potassium: 4.3 mEq/L (ref 3.5–5.1)
Sodium: 136 mEq/L (ref 135–145)

## 2022-10-12 LAB — CBC WITH DIFFERENTIAL/PLATELET
Basophils Absolute: 0.1 10*3/uL (ref 0.0–0.1)
Basophils Relative: 0.8 % (ref 0.0–3.0)
Eosinophils Absolute: 0.4 10*3/uL (ref 0.0–0.7)
Eosinophils Relative: 5.5 % — ABNORMAL HIGH (ref 0.0–5.0)
HCT: 43.7 % (ref 39.0–52.0)
Hemoglobin: 15.2 g/dL (ref 13.0–17.0)
Lymphocytes Relative: 38.9 % (ref 12.0–46.0)
Lymphs Abs: 2.8 10*3/uL (ref 0.7–4.0)
MCHC: 34.7 g/dL (ref 30.0–36.0)
MCV: 88.9 fl (ref 78.0–100.0)
Monocytes Absolute: 0.6 10*3/uL (ref 0.1–1.0)
Monocytes Relative: 8.8 % (ref 3.0–12.0)
Neutro Abs: 3.3 10*3/uL (ref 1.4–7.7)
Neutrophils Relative %: 46 % (ref 43.0–77.0)
Platelets: 427 10*3/uL — ABNORMAL HIGH (ref 150.0–400.0)
RBC: 4.92 Mil/uL (ref 4.22–5.81)
RDW: 13 % (ref 11.5–15.5)
WBC: 7.2 10*3/uL (ref 4.0–10.5)

## 2022-10-12 LAB — LIPID PANEL
Cholesterol: 202 mg/dL — ABNORMAL HIGH (ref 0–200)
HDL: 44.8 mg/dL (ref >39.00)
NonHDL: 157.67
Total CHOL/HDL Ratio: 5
Triglycerides: 240 mg/dL — ABNORMAL HIGH (ref 0.0–149.0)
VLDL: 48 mg/dL — ABNORMAL HIGH (ref 0.0–40.0)

## 2022-10-12 LAB — T3, FREE: T3, Free: 3.3 pg/mL (ref 2.3–4.2)

## 2022-10-12 LAB — T4, FREE: Free T4: 0.69 ng/dL (ref 0.60–1.60)

## 2022-10-12 LAB — TSH: TSH: 5.41 u[IU]/mL (ref 0.35–5.50)

## 2022-10-12 LAB — LDL CHOLESTEROL, DIRECT: Direct LDL: 142 mg/dL

## 2022-10-12 LAB — HEMOGLOBIN A1C: Hgb A1c MFr Bld: 5.8 % (ref 4.6–6.5)

## 2022-10-12 MED ORDER — LEVOTHYROXINE SODIUM 75 MCG PO TABS: 75.0000 ug | ORAL_TABLET | Freq: Every day | ORAL | 3 refills | Status: DC

## 2022-10-12 NOTE — Progress Notes (Signed)
Subjective:    Patient ID: Roberto Powers, male    DOB: 1991-01-08, 32 y.o.   MRN: [id number]  HPI Here for a well exam. He has been doing well in general, although he is getting over a recent flare up of his allergies. He is interested in allergy testing. His ADHd is stable, and he stopped taking Adderall last year.    Review of Systems  Constitutional: Negative.   HENT:  Positive for congestion, postnasal drip and rhinorrhea.   Eyes: Negative.   Respiratory: Negative.    Cardiovascular: Negative.   Gastrointestinal: Negative.   Genitourinary: Negative.   Musculoskeletal: Negative.   Skin: Negative.   Neurological: Negative.   Psychiatric/Behavioral: Negative.         Objective:   Physical Exam Constitutional:      General: He is not in acute distress.    Appearance: Normal appearance. He is well-developed. He is not diaphoretic.  HENT:     Head: Normocephalic and atraumatic.     Right Ear: External ear normal.     Left Ear: External ear normal.     Nose: Nose normal.     Mouth/Throat:     Pharynx: No oropharyngeal exudate.  Eyes:     General: No scleral icterus.       Right eye: No discharge.        Left eye: No discharge.     Conjunctiva/sclera: Conjunctivae normal.     Pupils: Pupils are equal, round, and reactive to light.  Neck:     Thyroid: No thyromegaly.     Vascular: No JVD.     Trachea: No tracheal deviation.  Cardiovascular:     Rate and Rhythm: Normal rate and regular rhythm.     Pulses: Normal pulses.     Heart sounds: Normal heart sounds. No murmur heard.    No friction rub. No gallop.  Pulmonary:     Effort: Pulmonary effort is normal. No respiratory distress.     Breath sounds: Normal breath sounds. No wheezing or rales.  Chest:     Chest wall: No tenderness.  Abdominal:     General: Bowel sounds are normal. There is no distension.     Palpations: Abdomen is soft. There is no mass.     Tenderness: There is no abdominal tenderness. There  is no guarding or rebound.  Genitourinary:    Penis: Normal. No tenderness.      Testes: Normal.  Musculoskeletal:        General: No tenderness. Normal range of motion.     Cervical back: Neck supple.  Lymphadenopathy:     Cervical: No cervical adenopathy.  Skin:    General: Skin is warm and dry.     Coloration: Skin is not pale.     Findings: No erythema or rash.  Neurological:     Mental Status: He is alert and oriented to person, place, and time.     Cranial Nerves: No cranial nerve deficit.     Motor: No abnormal muscle tone.     Coordination: Coordination normal.     Deep Tendon Reflexes: Reflexes are normal and symmetric. Reflexes normal.  Psychiatric:        Behavior: Behavior normal.        Thought Content: Thought content normal.        Judgment: Judgment normal.           Assessment & Plan:  Well exam. We discussed diet and exercise. Get fasting  labs. Refer to Allergy.  Gershon Crane, MD

## 2022-10-14 MED ORDER — LEVOTHYROXINE SODIUM 100 MCG PO TABS: 100.0000 ug | ORAL_TABLET | Freq: Every day | ORAL | 3 refills | Status: DC

## 2022-10-14 NOTE — Addendum Note (Signed)
Addended by: Christy Sartorius on: 10/14/2022 02:04 PM   Modules accepted: Orders

## 2022-10-15 NOTE — Addendum Note (Signed)
Addended by: Johnella Moloney on: 10/15/2022 10:48 AM   Modules accepted: Orders

## 2023-08-11 ENCOUNTER — Other Ambulatory Visit: Payer: Self-pay | Admitting: Family Medicine

## 2023-08-11 NOTE — Telephone Encounter (Signed)
 Last Fill: 10/14/22  Last OV: 10/12/22 Next OV: None Scheduled  Routing to provider for review/authorization.

## 2023-08-11 NOTE — Telephone Encounter (Signed)
 Copied from CRM [id number]. Topic: Clinical - Medication Refill >> Aug 11, 2023  3:56 PM Alphonzo Lemmings O wrote: Most Recent Primary Care Visit:  Provider: Gershon Crane A  Department: [facility]  Visit Type: OFFICE VISIT  Date: 10/12/2022  Medication: levothyroxine (SYNTHROID) 100 MCG tablet   Has the patient contacted their pharmacy? Yes they said it had changed so they was waiting for Dr approval to give it out  (Agent: If no, request that the patient contact the pharmacy for the refill. If patient does not wish to contact the pharmacy document the reason why and proceed with request.) (Agent: If yes, when and what did the pharmacy advise?)  Is this the correct pharmacy for this prescription? Yes If no, delete pharmacy and type the correct one.  This is the patient's preferred pharmacy:  [facility] [address], [place] - [address] [place] [address] Phone: [phone] Fax: [phone]   Has the prescription been filled recently? No  Is the patient out of the medication? Yes had to start taking old ones at 75 mcg cause I didn't want to go without   Has the patient been seen for an appointment in the last year OR does the patient have an upcoming appointment? Yes  Can we respond through MyChart? Yes  Agent: Please be advised that Rx refills may take up to 3 business days. We ask that you follow-up with your pharmacy.

## 2023-08-12 MED ORDER — LEVOTHYROXINE SODIUM 100 MCG PO TABS: 100.0000 ug | ORAL_TABLET | Freq: Every day | ORAL | 3 refills | Status: DC

## 2023-08-13 NOTE — Telephone Encounter (Signed)
 Patient is needing his medication refill

## 2023-11-30 ENCOUNTER — Telehealth: Payer: Self-pay | Admitting: Family Medicine

## 2023-11-30 DIAGNOSIS — J039 Acute tonsillitis, unspecified: Secondary | ICD-10-CM

## 2023-11-30 MED ORDER — AZITHROMYCIN 250 MG PO TABS
ORAL_TABLET | ORAL | 0 refills | Status: DC
Start: 2023-11-30 — End: 2023-12-13

## 2023-11-30 NOTE — Progress Notes (Signed)
 Virtual Visit Consent   Roberto Powers, you are scheduled for a virtual visit with a [facility] provider today. Just as with appointments in the office, your consent must be obtained to participate. Your consent will be active for this visit and any virtual visit you may have with one of our providers in the next 365 days. If you have a MyChart account, a copy of this consent can be sent to you electronically.  As this is a virtual visit, video technology does not allow for your provider to perform a traditional examination. This may limit your provider's ability to fully assess your condition. If your provider identifies any concerns that need to be evaluated in person or the need to arrange testing (such as labs, EKG, etc.), we will make arrangements to do so. Although advances in technology are sophisticated, we cannot ensure that it will always work on either your end or our end. If the connection with a video visit is poor, the visit may have to be switched to a telephone visit. With either a video or telephone visit, we are not always able to ensure that we have a secure connection.  By engaging in this virtual visit, you consent to the provision of healthcare and authorize for your insurance to be billed (if applicable) for the services provided during this visit. Depending on your insurance coverage, you may receive a charge related to this service.  I need to obtain your verbal consent now. Are you willing to proceed with your visit today? Roberto Powers has provided verbal consent on 11/30/2023 for a virtual visit (video or telephone). Roberto CHRISTELLA Barefoot, NP  Date: 11/30/2023 11:16 AM   Virtual Visit via Video Note   I, Roberto Powers, connected with  Roberto Powers  ([id number], Mar 06, 1991) on 11/30/23 at 11:15 AM EDT by a video-enabled telemedicine application and verified that I am speaking with the correct person using two identifiers.  Location: Patient: Virtual Visit Location  Patient: Home Provider: Virtual Visit Location Provider: Home Office   I discussed the limitations of evaluation and management by telemedicine and the availability of in person appointments. The patient expressed understanding and agreed to proceed.    History of Present Illness: Roberto Powers is a 33 y.o. who identifies as a male who was assigned male at birth, and is being seen today for sore throat and sinus  Thought it was allergies post mowing the yard-last Wednesday, also noted heat like exhaustion as well then. Rested but then developed sore throat. Cough is mild and just to clear the throat area. Some sinus congestion is noted as well.  A lot of fatigue. Did have a questionable tonsil stones vs exudate  Using day and nyquil, otc allegra and flonase   Covid test negative. No fevers    Problems:  Patient Active Problem List   Diagnosis Date Noted   Hypothyroidism 05/07/2021   ADHD (attention deficit hyperactivity disorder), inattentive type 07/08/2020   Environmental and seasonal allergies 11/22/2018   Anxiety state 12/24/2015   TOURETTE'S DISORDER 12/17/2008   MIGRAINE HEADACHE 12/17/2008    Allergies:  Allergies  Allergen Reactions   Penicillins    Medications:  Current Outpatient Medications:    clonazePAM  (KLONOPIN ) 1 MG tablet, Take 1 tablet (1 mg total) by mouth 3 (three) times daily as needed., Disp: 270 tablet, Rfl: 1   levothyroxine  (SYNTHROID ) 100 MCG tablet, Take 1 tablet (100 mcg total) by mouth daily., Disp: 90 tablet, Rfl: 3  Observations/Objective: Patient is well-developed, well-nourished in no acute distress. Resting comfortably  at home.  Head is normocephalic, atraumatic.  No labored breathing.  Speech is clear and coherent with logical content.  Patient is alert and oriented at baseline.    Assessment and Plan:  1. Tonsillitis (Primary)  - azithromycin  (ZITHROMAX ) 250 MG tablet; Take 2 tablets on day 1, then 1 tablet daily on days 2  through 5  Dispense: 6 tablet; Refill: 0    -Suspect bacterial pharyngitis - Discussed OTC management - May use tylenol and ibuprofen alternating every 3-4 hours as needed for pain, fevers, body aches - Salt water gargles - Hydration and voice rest - Seek in person evaluation if worsening or fails to improve    Reviewed side effects, risks and benefits of medication.    Patient acknowledged agreement and understanding of the plan.   Past Medical, Surgical, Social History, Allergies, and Medications have been Reviewed.     Follow Up Instructions: I discussed the assessment and treatment plan with the patient. The patient was provided an opportunity to ask questions and all were answered. The patient agreed with the plan and demonstrated an understanding of the instructions.  A copy of instructions were sent to the patient via MyChart unless otherwise noted below.    The patient was advised to call back or seek an in-person evaluation if the symptoms worsen or if the condition fails to improve as anticipated.    Roberto CHRISTELLA Barefoot, NP

## 2023-11-30 NOTE — Patient Instructions (Addendum)
  Roberto Powers, thank you for joining Roberto CHRISTELLA Barefoot, NP for today's virtual visit.  While this provider is not your primary care provider (PCP), if your PCP is located in our provider database this encounter information will be shared with them immediately following your visit.   A [name] MyChart account gives you access to today's visit and all your visits, tests, and labs performed at [facility]  click here if you don't have a [facility] MyChart account or go to [email or web address]  Consent: (Patient) Roberto Powers provided verbal consent for this virtual visit at the beginning of the encounter.  Current Medications:  Current Outpatient Medications:    azithromycin  (ZITHROMAX ) 250 MG tablet, Take 2 tablets on day 1, then 1 tablet daily on days 2 through 5, Disp: 6 tablet, Rfl: 0   clonazePAM  (KLONOPIN ) 1 MG tablet, Take 1 tablet (1 mg total) by mouth 3 (three) times daily as needed., Disp: 270 tablet, Rfl: 1   levothyroxine  (SYNTHROID ) 100 MCG tablet, Take 1 tablet (100 mcg total) by mouth daily., Disp: 90 tablet, Rfl: 3   Medications ordered in this encounter:  Meds ordered this encounter  Medications   azithromycin  (ZITHROMAX ) 250 MG tablet    Sig: Take 2 tablets on day 1, then 1 tablet daily on days 2 through 5    Dispense:  6 tablet    Refill:  0    Supervising Provider:   LAMPTEY, PHILIP O [phone]     *If you need refills on other medications prior to your next appointment, please contact your pharmacy*  Follow-Up: Call back or seek an in-person evaluation if the symptoms worsen or if the condition fails to improve as anticipated.  [facility] [phone]  Other Instructions    -Suspect bacterial pharyngitis - Discussed OTC management - May use tylenol and ibuprofen alternating every 3-4 hours as needed for pain, fevers, body aches - Salt water gargles - Hydration and voice rest - Seek in person evaluation if worsening  or fails to improve    If you have been instructed to have an in-person evaluation today at a local Urgent Care facility, please use the link below. It will take you to a list of all of our available [facility], including address, phone number and hours of operation. Please do not delay care.  [facility]  If you or a family member do not have a primary care provider, use the link below to schedule a visit and establish care. When you choose a [facility] primary care physician or advanced practice provider, you gain a long-term partner in health. Find a Primary Care Provider  Learn more about [name] in-office and virtual care options: [facility] Get [facility] Now

## 2023-12-02 ENCOUNTER — Other Ambulatory Visit: Payer: Self-pay | Admitting: Family Medicine

## 2023-12-02 NOTE — Telephone Encounter (Unsigned)
 Copied from CRM [id number]. Topic: Clinical - Medication Refill >> Dec 02, 2023  9:39 AM Shereese L wrote: Medication: levothyroxine  (SYNTHROID ) 100 MCG tablet  Has the patient contacted their pharmacy? Yes (Agent: If no, request that the patient contact the pharmacy for the refill. If patient does not wish to contact the pharmacy document the reason why and proceed with request.) (Agent: If yes, when and what did the pharmacy advise?)  This is the patient's preferred pharmacy:  [facility] [address], [place] - [address] [place] [address] Phone: [phone] Fax: [phone]  Is this the correct pharmacy for this prescription? Yes If no, delete pharmacy and type the correct one.   Has the prescription been filled recently? Yes  Is the patient out of the medication? Yes  Has the patient been seen for an appointment in the last year OR does the patient have an upcoming appointment? Yes  Can we respond through MyChart? Yes  Agent: Please be advised that Rx refills may take up to 3 business days. We ask that you follow-up with your pharmacy.

## 2023-12-05 ENCOUNTER — Other Ambulatory Visit: Payer: Self-pay | Admitting: Family Medicine

## 2023-12-08 ENCOUNTER — Other Ambulatory Visit: Payer: Self-pay | Admitting: Family Medicine

## 2023-12-08 ENCOUNTER — Other Ambulatory Visit: Payer: Self-pay

## 2023-12-08 NOTE — Telephone Encounter (Signed)
 See prescription refill request below- Levothyroxine   [facility] notes that last prescription on fill for them 10/2022.   Need a new prescription sent.  Pt is completely out.    Med pended and Message routed appropriately.

## 2023-12-08 NOTE — Telephone Encounter (Signed)
 Copied from CRM [id number]. Topic: Clinical - Medication Refill >> Dec 02, 2023  9:39 AM Shereese L wrote: Medication: levothyroxine  (SYNTHROID ) 100 MCG tablet  Has the patient contacted their pharmacy? Yes (Agent: If no, request that the patient contact the pharmacy for the refill. If patient does not wish to contact the pharmacy document the reason why and proceed with request.) (Agent: If yes, when and what did the pharmacy advise?)  This is the patient's preferred pharmacy:  [facility] [address], [place] - [address] [place] [address] Phone: [phone] Fax: [phone]  Is this the correct pharmacy for this prescription? Yes If no, delete pharmacy and type the correct one.   Has the prescription been filled recently? Yes  Is the patient out of the medication? Yes  Has the patient been seen for an appointment in the last year OR does the patient have an upcoming appointment? Yes  Can we respond through MyChart? Yes  Agent: Please be advised that Rx refills may take up to 3 business days. We ask that you follow-up with your pharmacy. >> Dec 08, 2023 10:16 AM Franky GRADE wrote: Patient is calling for the third time regarding the refill request, he followed up with the pharmacy but they have not gotten any prescription. He would like to know if there is any problems he is out and needs his medication.  >> Dec 06, 2023 11:02 AM Lavanda D wrote: Patient is calling for an update on this refill, received emergency refill.

## 2023-12-08 NOTE — Telephone Encounter (Signed)
 Copied from CRM [id number]. Topic: Clinical - Medication Refill >> Dec 08, 2023  2:47 PM Berneda F wrote: Medication: levothyroxine  (SYNTHROID ) 100 MCG tablet  Pt has an appt scheduled for Monday 12/13/23 and would like enough medication to last him until this appt please. Pt states he has been off this medication for a week now due to being unable to get it refilled.  Has the patient contacted their pharmacy? Yes (Agent: If no, request that the patient contact the pharmacy for the refill. If patient does not wish to contact the pharmacy document the reason why and proceed with request.) (Agent: If yes, when and what did the pharmacy advise?)  This is the patient's preferred pharmacy:  [facility] [address], [place] - [address] [place] [address] Phone: [phone] Fax: [phone]  Is this the correct pharmacy for this prescription? Yes If no, delete pharmacy and type the correct one.   Has the prescription been filled recently? No  Is the patient out of the medication? Yes  Has the patient been seen for an appointment in the last year OR does the patient have an upcoming appointment? Yes, scheduled an appt for Monday 12/13/2023  Can we respond through MyChart? Yes  Agent: Please be advised that Rx refills may take up to 3 business days. We ask that you follow-up with your pharmacy.

## 2023-12-08 NOTE — Telephone Encounter (Signed)
 Lm for a return call. Tried to call to get pt set up with a CPE to lab work for [facility] and other labs.

## 2023-12-13 ENCOUNTER — Ambulatory Visit (INDEPENDENT_AMBULATORY_CARE_PROVIDER_SITE_OTHER): Payer: Self-pay | Admitting: Family Medicine

## 2023-12-13 ENCOUNTER — Encounter: Payer: Self-pay | Admitting: Family Medicine

## 2023-12-13 VITALS — BP 102/64 | HR 82 | Temp 98.1°F | Ht 68.0 in | Wt 184.0 lb

## 2023-12-13 DIAGNOSIS — E039 Hypothyroidism, unspecified: Secondary | ICD-10-CM

## 2023-12-13 DIAGNOSIS — R7989 Other specified abnormal findings of blood chemistry: Secondary | ICD-10-CM

## 2023-12-13 MED ORDER — CLONAZEPAM 1 MG PO TABS: 1.0000 mg | ORAL_TABLET | Freq: Three times a day (TID) | ORAL | 1 refills | Status: AC | PRN

## 2023-12-13 MED ORDER — LEVOTHYROXINE SODIUM 100 MCG PO TABS: 100.0000 ug | ORAL_TABLET | Freq: Every day | ORAL | 3 refills | Status: AC

## 2023-12-13 NOTE — Progress Notes (Signed)
   Subjective:    Patient ID: Roberto Powers, male    DOB: 04/05/1991, 33 y.o.   MRN: [id number]  HPI Here to follow up on hypothyroidism and anxiety. He does not use the Clonazepam  very often, but he had a panic attack last week and he had nothing to treat it with.    Review of Systems  Constitutional: Negative.   Respiratory: Negative.    Cardiovascular: Negative.   Endocrine: Negative.   Psychiatric/Behavioral:  Positive for decreased concentration. Negative for agitation, confusion, dysphoric mood, hallucinations and sleep disturbance. The patient is nervous/anxious.        Objective:   Physical Exam Constitutional:      Appearance: Normal appearance.  Cardiovascular:     Rate and Rhythm: Normal rate and regular rhythm.     Pulses: Normal pulses.     Heart sounds: Normal heart sounds.  Pulmonary:     Effort: Pulmonary effort is normal.     Breath sounds: Normal breath sounds.  Neurological:     General: No focal deficit present.     Mental Status: He is alert and oriented to person, place, and time.  Psychiatric:        Behavior: Behavior normal.        Thought Content: Thought content normal.     Comments: Anxious            Assessment & Plan:  For the hypothyroidism, we will check levels today. His anxiety is stable so we will refill the Clonazepam .  Garnette Olmsted, MD

## 2023-12-14 ENCOUNTER — Ambulatory Visit: Payer: Self-pay | Admitting: Family Medicine

## 2023-12-14 LAB — T4, FREE: Free T4: 0.92 ng/dL (ref 0.60–1.60)

## 2023-12-14 LAB — TSH: TSH: 1.23 u[IU]/mL (ref 0.35–5.50)

## 2023-12-14 LAB — T3, FREE: T3, Free: 3.2 pg/mL (ref 2.3–4.2)

## 2024-02-09 ENCOUNTER — Encounter: Payer: Self-pay | Admitting: Family Medicine

## 2024-02-09 ENCOUNTER — Ambulatory Visit (INDEPENDENT_AMBULATORY_CARE_PROVIDER_SITE_OTHER): Payer: Self-pay | Admitting: Family Medicine

## 2024-02-09 VITALS — BP 126/78 | HR 106 | Temp 98.5°F | Ht 69.0 in | Wt 186.0 lb

## 2024-02-09 DIAGNOSIS — Z111 Encounter for screening for respiratory tuberculosis: Secondary | ICD-10-CM

## 2024-02-09 DIAGNOSIS — Z0000 Encounter for general adult medical examination without abnormal findings: Secondary | ICD-10-CM

## 2024-02-09 DIAGNOSIS — Z23 Encounter for immunization: Secondary | ICD-10-CM

## 2024-02-09 NOTE — Progress Notes (Signed)
 Subjective:    Patient ID: Roberto Powers, male    DOB: Mar 15, 1991, 33 y.o.   MRN: [id number]  HPI Here for a well exam. He feels fine. We checked a thyroid  panel in July, and his current dose of Levothyroxine  is correct. 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    Review of Systems  Constitutional: Negative.   HENT: Negative.    Eyes: Negative.   Respiratory: Negative.    Cardiovascular: Negative.   Gastrointestinal: Negative.   Genitourinary: Negative.   Musculoskeletal: Negative.   Skin: Negative.   Neurological: Negative.   Psychiatric/Behavioral: Negative.         Objective:   Physical Exam Constitutional:      General: He is not in acute distress.    Appearance: Normal appearance. He is well-developed. He is not diaphoretic.  HENT:     Head: Normocephalic and atraumatic.     Right Ear: External ear normal.     Left Ear: External ear normal.     Nose: Nose normal.     Mouth/Throat:     Pharynx: No oropharyngeal exudate.  Eyes:     General: No scleral icterus.       Right eye: No discharge.        Left eye: No discharge.     Conjunctiva/sclera: Conjunctivae normal.     Pupils: Pupils are equal, round, and reactive to light.  Neck:     Thyroid : No thyromegaly.     Vascular: No JVD.     Trachea: No tracheal deviation.  Cardiovascular:     Rate and Rhythm: Normal rate and regular rhythm.     Pulses: Normal pulses.     Heart sounds: Normal heart sounds. No murmur heard.    No friction rub. No gallop.  Pulmonary:     Effort: Pulmonary effort is normal. No respiratory distress.     Breath sounds: Normal breath sounds. No wheezing or rales.  Chest:     Chest wall: No tenderness.  Abdominal:     General: Bowel sounds are normal. There is no distension.     Palpations: Abdomen is soft. There is no mass.     Tenderness: There is no abdominal tenderness. There is no guarding or rebound.  Genitourinary:    Penis: Normal. No  tenderness.      Testes: Normal.  Musculoskeletal:        General: No tenderness. Normal range of motion.     Cervical back: Neck supple.  Lymphadenopathy:     Cervical: No cervical adenopathy.  Skin:    General: Skin is warm and dry.     Coloration: Skin is not pale.     Findings: No erythema or rash.  Neurological:     General: No focal deficit present.     Mental Status: He is alert and oriented to person, place, and time.     Cranial Nerves: No cranial nerve deficit.     Motor: No abnormal muscle tone.     Coordination: Coordination normal.     Deep Tendon Reflexes: Reflexes are normal and symmetric. Reflexes normal.  Psychiatric:        Mood and Affect: Mood normal.        Behavior: Behavior normal.        Thought Content: Thought content normal.        Judgment: Judgment normal.           Assessment & Plan:  Well exam. We discussed diet and exercise. 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  Garnette Olmsted, MD

## 2024-02-09 NOTE — Progress Notes (Signed)
 Tuberculin skin test applied to Right ventral forearm. Explained to return in 72 hrs (Friday) to read the test.

## 2024-02-11 LAB — TB SKIN TEST
Induration: 0 mm
TB Skin Test: NEGATIVE
